# Patient Record
Sex: Male | Born: 1985 | Hispanic: No | Marital: Married | Smoking: Never smoker
Health system: Southern US, Community
[De-identification: ages and names within clinical notes are randomized; demographics above are authoritative.]

## PROBLEM LIST (undated history)

## (undated) DIAGNOSIS — L309 Dermatitis, unspecified: Secondary | ICD-10-CM

## (undated) DIAGNOSIS — K589 Irritable bowel syndrome without diarrhea: Secondary | ICD-10-CM

## (undated) DIAGNOSIS — Z8711 Personal history of peptic ulcer disease: Secondary | ICD-10-CM

## (undated) DIAGNOSIS — Z8719 Personal history of other diseases of the digestive system: Secondary | ICD-10-CM

## (undated) DIAGNOSIS — Z23 Encounter for immunization: Secondary | ICD-10-CM

## (undated) DIAGNOSIS — L509 Urticaria, unspecified: Secondary | ICD-10-CM

## (undated) HISTORY — DX: Irritable bowel syndrome, unspecified: K58.9

## (undated) HISTORY — DX: Encounter for immunization: Z23

## (undated) HISTORY — DX: Urticaria, unspecified: L50.9

## (undated) HISTORY — PX: FETAL BLOOD TRANSFUSION: SHX1602

## (undated) HISTORY — DX: Dermatitis, unspecified: L30.9

## (undated) HISTORY — DX: Personal history of peptic ulcer disease: Z87.11

---
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
## 2017-08-07 ENCOUNTER — Emergency Department (HOSPITAL_COMMUNITY): Payer: No Typology Code available for payment source

## 2017-08-07 ENCOUNTER — Encounter (HOSPITAL_COMMUNITY): Payer: Self-pay

## 2017-08-07 ENCOUNTER — Emergency Department (HOSPITAL_COMMUNITY)
Admission: EM | Admit: 2017-08-07 | Discharge: 2017-08-07 | Disposition: A | Discharge status: Home / Self Care | Payer: No Typology Code available for payment source | Attending: Emergency Medicine | Admitting: Emergency Medicine

## 2017-08-07 DIAGNOSIS — Y9389 Activity, other specified: Secondary | ICD-10-CM | POA: Diagnosis not present

## 2017-08-07 DIAGNOSIS — S161XXA Strain of muscle, fascia and tendon at neck level, initial encounter: Secondary | ICD-10-CM | POA: Diagnosis not present

## 2017-08-07 DIAGNOSIS — Y999 Unspecified external cause status: Secondary | ICD-10-CM | POA: Insufficient documentation

## 2017-08-07 DIAGNOSIS — S1980XA Other specified injuries of unspecified part of neck, initial encounter: Secondary | ICD-10-CM | POA: Diagnosis present

## 2017-08-07 DIAGNOSIS — M25561 Pain in right knee: Secondary | ICD-10-CM | POA: Diagnosis not present

## 2017-08-07 DIAGNOSIS — Y92411 Interstate highway as the place of occurrence of the external cause: Secondary | ICD-10-CM | POA: Diagnosis not present

## 2017-08-07 MED ORDER — CYCLOBENZAPRINE HCL 10 MG PO TABS: 10.0000 mg | ORAL_TABLET | Freq: Two times a day (BID) | ORAL | 0 refills | Status: DC | PRN

## 2017-08-07 MED ORDER — IBUPROFEN 400 MG PO TABS
600.0000 mg | ORAL_TABLET | Freq: Once | ORAL | Status: AC
  Administered 2017-08-07: 600 mg via ORAL
  Filled 2017-08-07: qty 1

## 2017-08-07 NOTE — ED Notes (Signed)
Ortho tech called for soft collar, ED supply is currently out of stock.

## 2017-08-07 NOTE — ED Notes (Signed)
Pt stable, ambulatory, states understanding of discharge instructions 
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
## 2017-08-07 NOTE — ED Notes (Signed)
Ortho tech to bring soft collar (none in ED)

## 2017-08-07 NOTE — Discharge Instructions (Signed)
The pain your experiencing is likely due to muscle strain, you may take acetaminophen and flexeril as needed for pain management.You may use soft neck collar for comfort. The muscle soreness should improve over the next week. [facility] in the next week for a recheck if you are still having symptoms. Return to ED if pain is worsening, you develop weakness or numbness of extremities, or new or concerning symptoms develop.
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
## 2017-08-07 NOTE — Progress Notes (Signed)
Orthopedic Tech Progress Note Patient Details:  Caleb GrammesMinh Tieu Warner 02-11-1986 [id number]  Ortho Devices Type of Ortho Device: Soft collar Ortho Device/Splint Location: neck Ortho Device/Splint Interventions: Application   Caleb Warner 08/07/2017, 1:06 PM

## 2019-09-05 ENCOUNTER — Other Ambulatory Visit: Payer: Self-pay

## 2019-09-06 ENCOUNTER — Encounter: Payer: Self-pay | Admitting: Family Medicine

## 2019-09-06 ENCOUNTER — Ambulatory Visit (INDEPENDENT_AMBULATORY_CARE_PROVIDER_SITE_OTHER): Payer: BC Managed Care – PPO | Admitting: Family Medicine

## 2019-09-06 VITALS — BP 125/85 | HR 86 | Temp 98.2°F | Ht 68.5 in | Wt 153.2 lb

## 2019-09-06 DIAGNOSIS — Z0001 Encounter for general adult medical examination with abnormal findings: Secondary | ICD-10-CM

## 2019-09-06 DIAGNOSIS — R110 Nausea: Secondary | ICD-10-CM

## 2019-09-06 DIAGNOSIS — Z0184 Encounter for antibody response examination: Secondary | ICD-10-CM

## 2019-09-06 DIAGNOSIS — Z111 Encounter for screening for respiratory tuberculosis: Secondary | ICD-10-CM

## 2019-09-06 DIAGNOSIS — Z13220 Encounter for screening for lipoid disorders: Secondary | ICD-10-CM

## 2019-09-06 LAB — COMPREHENSIVE METABOLIC PANEL
ALT: 31 U/L (ref 0–53)
AST: 24 U/L (ref 0–37)
Albumin: 4.9 g/dL (ref 3.5–5.2)
Alkaline Phosphatase: 75 U/L (ref 39–117)
BUN: 8 mg/dL (ref 6–23)
CO2: 27 mEq/L (ref 19–32)
Calcium: 9.9 mg/dL (ref 8.4–10.5)
Chloride: 101 mEq/L (ref 96–112)
Creatinine, Ser: 0.82 mg/dL (ref 0.40–1.50)
GFR: 108.98 mL/min (ref >60.00)
Glucose, Bld: 100 mg/dL — ABNORMAL HIGH (ref 70–99)
Potassium: 3.8 mEq/L (ref 3.5–5.1)
Sodium: 138 mEq/L (ref 135–145)
Total Bilirubin: 0.8 mg/dL (ref 0.2–1.2)
Total Protein: 7.7 g/dL (ref 6.0–8.3)

## 2019-09-06 LAB — LIPID PANEL
Cholesterol: 168 mg/dL (ref 0–200)
HDL: 62.5 mg/dL (ref >39.00)
LDL Cholesterol: 75 mg/dL (ref 0–99)
NonHDL: 105.09
Total CHOL/HDL Ratio: 3
Triglycerides: 149 mg/dL (ref 0.0–149.0)
VLDL: 29.8 mg/dL (ref 0.0–40.0)

## 2019-09-06 LAB — CBC
HCT: 48.2 % (ref 39.0–52.0)
Hemoglobin: 16.2 g/dL (ref 13.0–17.0)
MCHC: 33.6 g/dL (ref 30.0–36.0)
MCV: 88.3 fl (ref 78.0–100.0)
Platelets: 315 10*3/uL (ref 150.0–400.0)
RBC: 5.46 Mil/uL (ref 4.22–5.81)
RDW: 12.9 % (ref 11.5–15.5)
WBC: 7.9 10*3/uL (ref 4.0–10.5)

## 2019-09-06 LAB — TSH: TSH: 0.85 u[IU]/mL (ref 0.35–4.50)

## 2019-09-06 NOTE — Patient Instructions (Signed)
It was very nice to see you today!  Keep up the good work!  We will check blood work today.  Come back in 1 year for your next physical, or sooner if needed.   Take care, Dr Jerline Pain  Please try these tips to maintain a healthy lifestyle:   Eat at least 3 REAL meals and 1-2 snacks per day.  Aim for no more than 5 hours between eating.  If you eat breakfast, please do so within one hour of getting up.    Obtain twice as many fruits/vegetables as protein or carbohydrate foods for both lunch and dinner. (Half of each meal should be fruits/vegetables, one quarter protein, and one quarter starchy carbs)   Cut down on sweet beverages. This includes juice, soda, and sweet tea.    Exercise at least 150 minutes every week.    Preventive Care 64-16 Years Old, Male Preventive care refers to lifestyle choices and visits with your health care provider that can promote health and wellness. This includes:  A yearly physical exam. This is also called an annual well check.  Regular dental and eye exams.  Immunizations.  Screening for certain conditions.  Healthy lifestyle choices, such as eating a healthy diet, getting regular exercise, not using drugs or products that contain nicotine and tobacco, and limiting alcohol use. What can I expect for my preventive care visit? Physical exam Your health care provider will check:  Height and weight. These may be used to calculate body mass index (BMI), which is a measurement that tells if you are at a healthy weight.  Heart rate and blood pressure.  Your skin for abnormal spots. Counseling Your health care provider may ask you questions about:  Alcohol, tobacco, and drug use.  Emotional well-being.  Home and relationship well-being.  Sexual activity.  Eating habits.  Work and work Statistician. What immunizations do I need?  Influenza (flu) vaccine  This is recommended every year. Tetanus, diphtheria, and pertussis (Tdap)  vaccine  You may need a Td booster every 10 years. Varicella (chickenpox) vaccine  You may need this vaccine if you have not already been vaccinated. Human papillomavirus (HPV) vaccine  If recommended by your health care provider, you may need three doses over 6 months. Measles, mumps, and rubella (MMR) vaccine  You may need at least one dose of MMR. You may also need a second dose. Meningococcal conjugate (MenACWY) vaccine  One dose is recommended if you are 60-88 years old and a Market researcher living in a residence hall, or if you have one of several medical conditions. You may also need additional booster doses. Pneumococcal conjugate (PCV13) vaccine  You may need this if you have certain conditions and were not previously vaccinated. Pneumococcal polysaccharide (PPSV23) vaccine  You may need one or two doses if you smoke cigarettes or if you have certain conditions. Hepatitis A vaccine  You may need this if you have certain conditions or if you travel or work in places where you may be exposed to hepatitis A. Hepatitis B vaccine  You may need this if you have certain conditions or if you travel or work in places where you may be exposed to hepatitis B. Haemophilus influenzae type b (Hib) vaccine  You may need this if you have certain risk factors. You may receive vaccines as individual doses or as more than one vaccine together in one shot (combination vaccines). Talk with your health care provider about the risks and benefits of combination vaccines.  What tests do I need? Blood tests  Lipid and cholesterol levels. These may be checked every 5 years starting at age 81.  Hepatitis C test.  Hepatitis B test. Screening   Diabetes screening. This is done by checking your blood sugar (glucose) after you have not eaten for a while (fasting).  Sexually transmitted disease (STD) testing. Talk with your health care provider about your test results, treatment  options, and if necessary, the need for more tests. Follow these instructions at home: Eating and drinking   Eat a diet that includes fresh fruits and vegetables, whole grains, lean protein, and low-fat dairy products.  Take vitamin and mineral supplements as recommended by your health care provider.  Do not drink alcohol if your health care provider tells you not to drink.  If you drink alcohol: ? Limit how much you have to 0-2 drinks a day. ? Be aware of how much alcohol is in your drink. In the [place] one drink equals one 12 oz bottle of beer (355 mL), one 5 oz glass of wine (148 mL), or one 1 oz glass of hard liquor (44 mL). Lifestyle  Take daily care of your teeth and gums.  Stay active. Exercise for at least 30 minutes on 5 or more days each week.  Do not use any products that contain nicotine or tobacco, such as cigarettes, e-cigarettes, and chewing tobacco. If you need help quitting, ask your health care provider.  If you are sexually active, practice safe sex. Use a condom or other form of protection to prevent STIs (sexually transmitted infections). What's next?  Go to your health care provider once a year for a well check visit.  Ask your health care provider how often you should have your eyes and teeth checked.  Stay up to date on all vaccines. This information is not intended to replace advice given to you by your health care provider. Make sure you discuss any questions you have with your health care provider. Document Released: 11/15/2001 Document Revised: 09/13/2018 Document Reviewed: 09/13/2018 Elsevier Patient Education  2020 [employer].

## 2019-09-06 NOTE — Progress Notes (Addendum)
Chief Complaint:  Caleb Warner is a 33 y.o. male who presents today for his annual comprehensive physical exam and to establish care.   Assessment/Plan:  Nausea No red flags.  Check CBC, C met, TSH.  Preventative Healthcare: Check CBC, C met, TSH, lipid panel.  Check QuantiFERON gold for school-received BCG vaccine as a child.  Needs to have proof of vaccination against hepatitis B and varicella for work.  We will draw titers as he was not born in this country and is not sure what vaccine he had in the past.  Patient Counseling(The following topics were reviewed and/or handout was given):  -Nutrition: Stressed importance of moderation in sodium/caffeine intake, saturated fat and cholesterol, caloric balance, sufficient intake of fresh fruits, vegetables, and fiber.  -Stressed the importance of regular exercise.   -Substance Abuse: Discussed cessation/primary prevention of tobacco, alcohol, or other drug use; driving or other dangerous activities under the influence; availability of treatment for abuse.   -Injury prevention: Discussed safety belts, safety helmets, smoke detector, smoking near bedding or upholstery.   -Sexuality: Discussed sexually transmitted diseases, partner selection, use of condoms, avoidance of unintended pregnancy and contraceptive alternatives.   -Dental health: Discussed importance of regular tooth brushing, flossing, and dental visits.  -Health maintenance and immunizations reviewed. Please refer to Health maintenance section.  Return to care in 1 year for next preventative visit.     Subjective:  HPI:  He has no acute complaints today.   He is having some increasing nausea for a few weeks when waking up in the morning.  No vomiting.  No abdominal pain.  His stable, chronic medical conditions are outlined below:   Lifestyle  Diet: Pescatarian  Exercise: Likes to walk and ride bicycles.   Depression screen PHQ 2/9 09/06/2019  Decreased Interest  0  Down, Depressed, Hopeless 0  PHQ - 2 Score 0    Health Maintenance Due  Topic Date Due  . HIV Screening  10/30/2002     ROS: Per HPI, otherwise a complete review of systems was negative.   PMH:  The following were reviewed and entered/updated in epic: Past Medical History:  Diagnosis Date  . History of stomach ulcers   . IBS (irritable bowel syndrome)    There are no active problems to display for this patient.  Past Surgical History:  Procedure Laterality Date  . FETAL BLOOD TRANSFUSION     Family History  Problem Relation Age of Onset  . Diabetes Maternal Grandmother   . Prostate cancer Neg Hx     Medications- reviewed and updated No current outpatient medications on file.   No current facility-administered medications for this visit.    Allergies-reviewed and updated No Known Allergies  Social History   Socioeconomic History  . Marital status: Married    Spouse name: Not on file  . Number of children: Not on file  . Years of education: Not on file  . Highest education level: Not on file  Occupational History  . Not on file  Social Needs  . Financial resource strain: Not on file  . Food insecurity    Worry: Not on file    Inability: Not on file  . Transportation needs    Medical: Not on file    Non-medical: Not on file  Tobacco Use  . Smoking status: Never Smoker  . Smokeless tobacco: Never Used  Substance and Sexual Activity  . Alcohol Use    Frequency: Never  . Drug use: No  .  Sexual activity: Not on file  Lifestyle  . Physical activity    Days per week: Not on file    Minutes per session: Not on file  . Stress: Not on file  Relationships  . Social Herbalist on phone: Not on file    Gets together: Not on file    Attends religious service: Not on file    Active member of club or organization: Not on file    Attends meetings of clubs or organizations: Not on file    Relationship status: Not on file  Other Topics Concern   . Not on file  Social History Narrative  . Not on file        Objective:  Physical Exam: BP 125/85   Pulse 86   Temp 98.2 F (36.8 C)   Ht 5' 8.5" (1.74 m)   Wt 153 lb 3.2 oz (69.5 kg)   SpO2 96%   BMI 22.96 kg/m   Body mass index is 22.96 kg/m. Wt Readings from Last 3 Encounters:  09/06/19 153 lb 3.2 oz (69.5 kg)  08/07/17 130 lb (59 kg)   Gen: NAD, resting comfortably HEENT: TMs normal bilaterally. OP clear. No thyromegaly noted.  CV: RRR with no murmurs appreciated Pulm: NWOB, CTAB with no crackles, wheezes, or rhonchi GI: Normal bowel sounds present. Soft, Nontender, Nondistended. MSK: no edema, cyanosis, or clubbing noted Skin: warm, dry Neuro: CN2-12 grossly intact. Strength 5/5 in upper and lower extremities. Reflexes symmetric and intact bilaterally.  Psych: Normal affect and thought content     Caleb Warner M. Jerline Pain, MD 09/06/2019 1:53 PM

## 2019-09-06 NOTE — Addendum Note (Signed)
Addended by: Vivi Barrack on: 09/06/2019 01:59 PM   Modules accepted: Orders

## 2019-09-09 LAB — QUANTIFERON-TB GOLD PLUS
Mitogen-NIL: 6.9 IU/mL
NIL: 0.46 IU/mL
QuantiFERON-TB Gold Plus: POSITIVE — AB
TB1-NIL: 2.22 IU/mL
TB2-NIL: 3.06 IU/mL

## 2019-09-09 LAB — HEPATITIS B CORE ANTIBODY, TOTAL: Hep B Core Total Ab: NONREACTIVE

## 2019-09-09 LAB — HEPATITIS B SURFACE ANTIBODY,QUALITATIVE: Hep B S Ab: REACTIVE — AB

## 2019-09-09 LAB — VARICELLA ZOSTER ANTIBODY, IGG: Varicella IgG: <135 index — ABNORMAL LOW

## 2019-09-09 LAB — HEPATITIS B SURFACE ANTIGEN: Hepatitis B Surface Ag: NONREACTIVE

## 2019-09-10 NOTE — Progress Notes (Signed)
Please inform patient of the following:  He is immune to hepatitis B - does not need any vaccine for this. He is NOT immune to varicella - recommend getting vaccine.  He can come here to get this.  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  Caleb Warner. Jerline Pain, MD 09/10/2019 8:02 AM

## 2019-09-11 ENCOUNTER — Telehealth: Payer: Self-pay | Admitting: Family Medicine

## 2019-09-11 ENCOUNTER — Other Ambulatory Visit: Payer: Self-pay

## 2019-09-11 DIAGNOSIS — R7612 Nonspecific reaction to cell mediated immunity measurement of gamma interferon antigen response without active tuberculosis: Secondary | ICD-10-CM

## 2019-09-11 NOTE — Telephone Encounter (Signed)
Copied from [place] [id number]. Topic: Quick Communication - See Telephone Encounter >> Sep 11, 2019  3:41 PM Loma Boston wrote: CRM for notification. See Telephone encounter for: 09/11/19. 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 Must determine [phone]

## 2019-09-12 ENCOUNTER — Ambulatory Visit: Payer: BC Managed Care – PPO | Admitting: Internal Medicine

## 2019-09-12 ENCOUNTER — Encounter: Payer: Self-pay | Admitting: Internal Medicine

## 2019-09-12 ENCOUNTER — Ambulatory Visit
Admission: RE | Admit: 2019-09-12 | Discharge: 2019-09-12 | Disposition: A | Discharge status: Home / Self Care | Payer: BC Managed Care – PPO | Source: Ambulatory Visit | Attending: Internal Medicine | Admitting: Internal Medicine

## 2019-09-12 ENCOUNTER — Other Ambulatory Visit: Payer: Self-pay

## 2019-09-12 DIAGNOSIS — R7612 Nonspecific reaction to cell mediated immunity measurement of gamma interferon antigen response without active tuberculosis: Secondary | ICD-10-CM

## 2019-09-12 DIAGNOSIS — Z227 Latent tuberculosis: Secondary | ICD-10-CM | POA: Insufficient documentation

## 2019-09-12 NOTE — Progress Notes (Signed)
[facility]  Reason for Consult: Latent tuberculosis Referring Provider: Dr. 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  He was born in an area of the world where TB was still endemic at the time of his birth.  QuantiFERON assay is not affected by BCG vaccination.  He does not have any clinical findings to suggest active TB but I will order a repeat chest x-ray today.  I will also screen him for HIV and see him back in 1 week to discuss treatment options.  The FDA recently announced that potentially carcinogenic nitrosamine impurities were found and rifampin and rifapentine.  It may be best to consider treatment with isoniazid.  Plan: 1. Check HIV antibody 2. Chest x-ray 3. Follow-up in 1 week  Patient Active Problem List   Diagnosis Date Noted  . Positive QuantiFERON-TB Gold test 09/12/2019    Patient's Medications   No medications on file    HPI: Caleb Warner is a 33 y.o. male who was referred for evaluation and management of a positive QuantiFERON TB Gold assay.  He was born in [place], [place] and lived there until he came to the [place] at age 7.  He is not sure if he was ever exposed to anyone with active TB.  He says that he has been told that he was given BCG vaccine when he was very young.  He has been in good health throughout his life with exception of one episode of GI bleeding caused by gastric ulcer several years ago.  He has had no other hospitalizations.  He is not on any medications.  He has no known risk factors for HIV.  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  He was evaluated at a local health department.  He had a normal chest x-ray and told that nothing more needed to be done.  He was not offered treatment for possible latent tuberculosis.  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  He  recently underwent a routine well exam with his PCP.  QuantiFERON TB Gold assay was positive.  He has not had any fever, chills or sweats.  He denies cough and shortness of breath.  His appetite remains normal and he has not lost any weight.  Review of Systems: Review of Systems  Constitutional: Negative for chills, diaphoresis, fever and weight loss.  Respiratory: Negative for cough, sputum production and shortness of breath.   Cardiovascular: Negative for chest pain.  Gastrointestinal: Negative for abdominal pain, diarrhea, nausea and vomiting.      Past Medical History:  Diagnosis Date  . History of stomach ulcers   . IBS (irritable bowel syndrome)   . Immunization, BCG     Social History   Tobacco Use  . Smoking status: Never Smoker  . Smokeless tobacco: Never Used  Substance Use Topics  . Alcohol use: Not Currently  . Drug use: No    Family History  Problem Relation Age of Onset  . Diabetes Maternal Grandmother   . Prostate cancer Neg Hx    No Known Allergies  OBJECTIVE: Vitals:   09/12/19 1454  BP: 131/82  Pulse: (!) 101  Weight: 152 lb 9.6 oz (69.2 kg)   Body mass index is 22.87 kg/m.   Physical Exam Constitutional:      Comments: He is  a healthy-appearing young man.  Cardiovascular:     Rate and Rhythm: Normal rate and regular rhythm.     Heart sounds: No murmur.  Pulmonary:     Effort: Pulmonary effort is normal.     Breath sounds: Normal breath sounds.  Abdominal:     Palpations: Abdomen is soft.     Tenderness: There is no abdominal tenderness.  Musculoskeletal:        General: No swelling or tenderness.  Lymphadenopathy:     Head:     Right side of head: No submandibular adenopathy.     Left side of head: No submandibular adenopathy.     Cervical: No cervical adenopathy.     Upper Body:     Right upper body: No axillary or epitrochlear adenopathy.     Left upper body: No axillary or epitrochlear adenopathy.  Skin:    Findings: No  rash.  Neurological:     General: No focal deficit present.  Psychiatric:        Mood and Affect: Mood normal.   Labs 09/06/2019  Lab Results  Component Value Date   WBC 7.9 09/06/2019   HGB 16.2 09/06/2019   HCT 48.2 09/06/2019   MCV 88.3 09/06/2019   PLT 315.0 09/06/2019   CMP     Component Value Date/Time   NA 138 09/06/2019 1408   K 3.8 09/06/2019 1408   CL 101 09/06/2019 1408   CO2 27 09/06/2019 1408   GLUCOSE 100 (H) 09/06/2019 1408   BUN 8 09/06/2019 1408   CREATININE 0.82 09/06/2019 1408   CALCIUM 9.9 09/06/2019 1408   PROT 7.7 09/06/2019 1408   ALBUMIN 4.9 09/06/2019 1408   AST 24 09/06/2019 1408   ALT 31 09/06/2019 1408   ALKPHOS 75 09/06/2019 1408   BILITOT 0.8 09/06/2019 1408    Microbiology: No results found for this or any previous visit (from the past 240 hour(s)).  Cliffton Asters, MD [facility] [phone] pager   [phone] cell 09/12/2019, 4:34 PM

## 2019-09-12 NOTE — Telephone Encounter (Signed)
Left voice message for patient to call clinic.  

## 2019-09-13 LAB — HIV ANTIBODY (ROUTINE TESTING W REFLEX): HIV 1&2 Ab, 4th Generation: NONREACTIVE

## 2019-09-18 ENCOUNTER — Ambulatory Visit: Payer: BC Managed Care – PPO | Admitting: Internal Medicine

## 2019-09-19 ENCOUNTER — Ambulatory Visit (INDEPENDENT_AMBULATORY_CARE_PROVIDER_SITE_OTHER): Payer: BC Managed Care – PPO | Admitting: Internal Medicine

## 2019-09-19 ENCOUNTER — Other Ambulatory Visit: Payer: Self-pay

## 2019-09-19 ENCOUNTER — Encounter: Payer: Self-pay | Admitting: Internal Medicine

## 2019-09-19 DIAGNOSIS — Z227 Latent tuberculosis: Secondary | ICD-10-CM | POA: Diagnosis not present

## 2019-09-19 NOTE — Progress Notes (Signed)
         [facility] Infectious Disease  Patient Active Problem List   Diagnosis Date Noted  . Latent tuberculosis by blood test 09/12/2019    Patient's Medications   No medications on file    Subjective: Caleb Warner is in for his routine follow-up visit.  He has a positive QuantiFERON TB Gold assay that probably represents latent tuberculosis acquired when he was an infant in [place].  He is HIV antibody negative.  Chest x-ray obtained last week was normal with no evidence of old TB granulomas or active infiltrates.  Review of Systems: Review of Systems  Constitutional: Negative for chills, diaphoresis, fever and weight loss.  Respiratory: Negative for cough, sputum production and shortness of breath.     Past Medical History:  Diagnosis Date  . History of stomach ulcers   . IBS (irritable bowel syndrome)   . Immunization, BCG     Social History   Tobacco Use  . Smoking status: Never Smoker  . Smokeless tobacco: Never Used  Substance Use Topics  . Alcohol use: Not Currently  . Drug use: No    Family History  Problem Relation Age of Onset  . Diabetes Maternal Grandmother   . Prostate cancer Neg Hx     No Known Allergies  Objective: Vitals:   09/19/19 1436  BP: 121/76  Pulse: (!) 101  Temp: 98.6 F (37 C)  TempSrc: Oral  Weight: 153 lb (69.4 kg)   Body mass index is 22.93 kg/m.  Physical Exam Constitutional:      Comments: He is in good spirits.  Cardiovascular:     Rate and Rhythm: Normal rate and regular rhythm.     Heart sounds: No murmur.  Pulmonary:     Effort: Pulmonary effort is normal.     Breath sounds: Normal breath sounds.  Psychiatric:        Mood and Affect: Mood normal.       Problem List Items Addressed This Visit      Unprioritized   Latent tuberculosis by blood test    I reviewed management options with him.  Treatment of latent tuberculosis is currently complicated by the fact that the FDA released and alert in August  of this year stating that rifamycin antibiotics have been found to contain nitrosamines which have been linked to cancer.  Standard treatment with isoniazid for 9 months is an option but I recommended that we simply revisit the decision about 6 months and see if the FDA alert has been lifted.  He is in agreement with that plan.  He will follow-up here in 6 months.          Michel Bickers, MD [facility] [phone] pager   [phone] cell 09/19/2019, 3:03 PM

## 2019-09-19 NOTE — Assessment & Plan Note (Signed)
I reviewed management options with him.  Treatment of latent tuberculosis is currently complicated by the fact that the FDA released and alert in August of this year stating that rifamycin antibiotics have been found to contain nitrosamines which have been linked to cancer.  Standard treatment with isoniazid for 9 months is an option but I recommended that we simply revisit the decision about 6 months and see if the FDA alert has been lifted.  He is in agreement with that plan.  He will follow-up here in 6 months.

## 2020-10-20 IMAGING — DX DG CHEST 2V
2 series · 2 of 2 positions shown · non-contrast
Comparison: None.

CLINICAL DATA: Positive QuantiFERON TB test.

EXAM:
CHEST - 2 VIEW

[dg chest 2 view (1 of 2)]
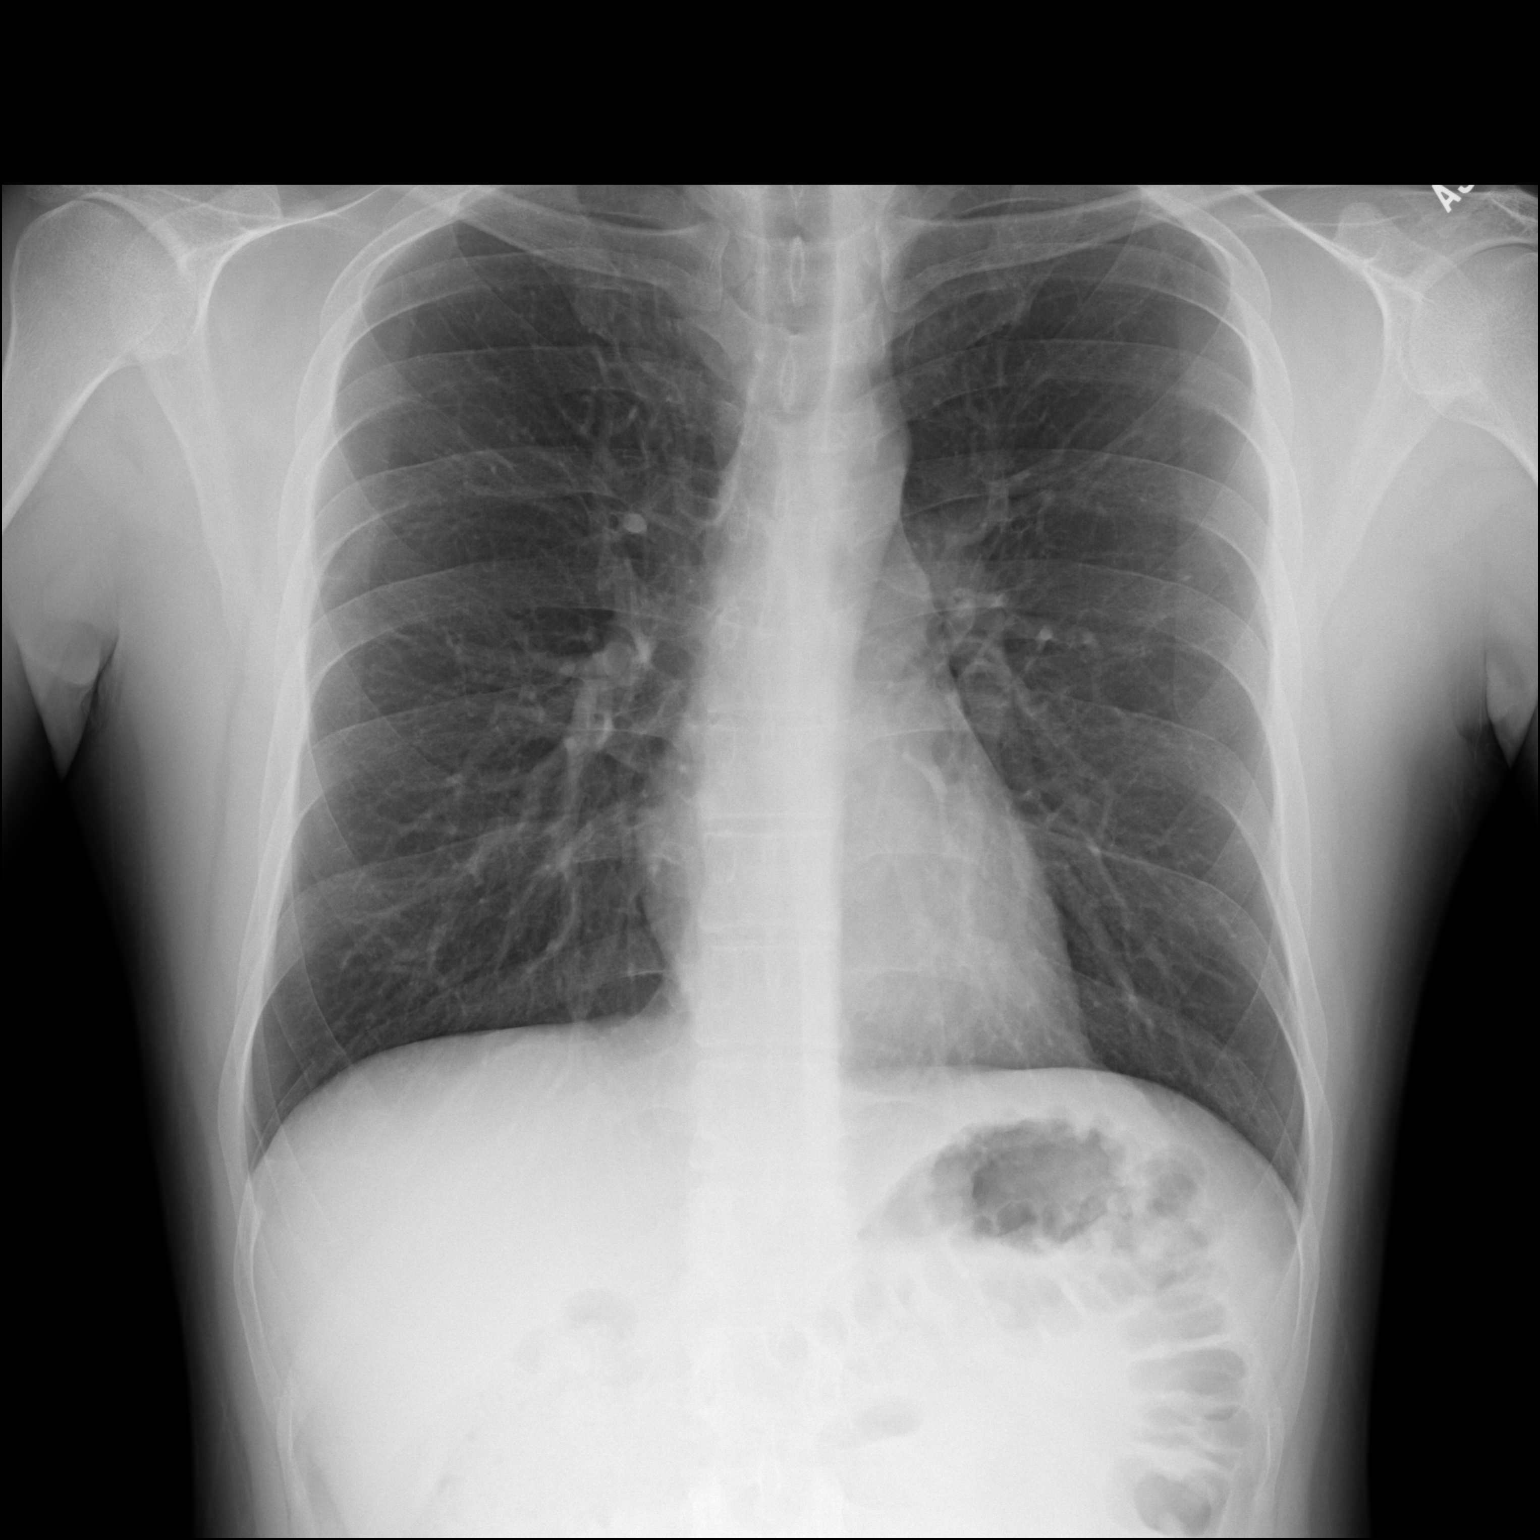

[dg chest 2 view (2 of 2)]
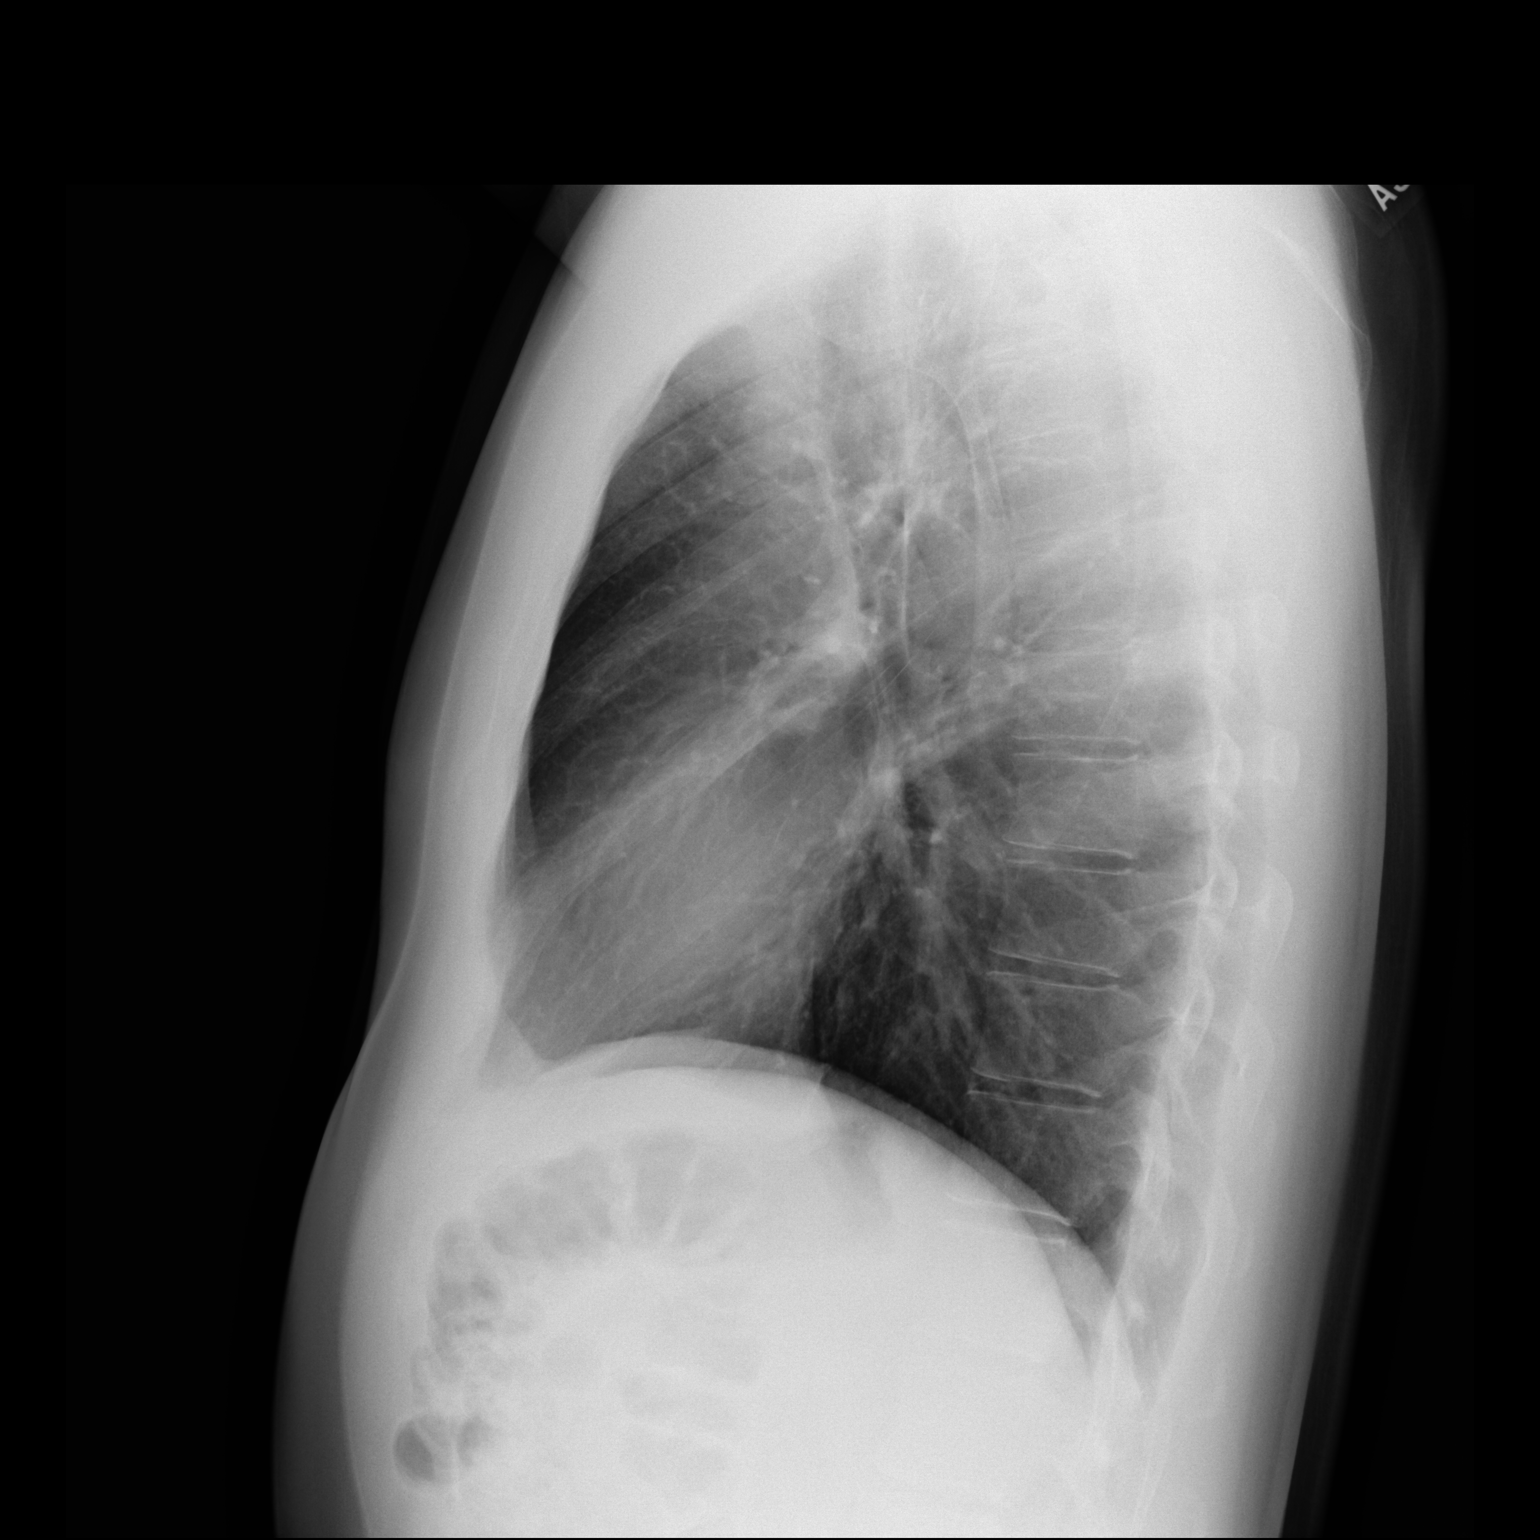

[2 of 2 positions shown; findings below may reference images not displayed]

FINDINGS: The heart size and mediastinal contours are within normal limits.
Both lungs are clear. The visualized skeletal structures are
unremarkable.
IMPRESSION: No active cardiopulmonary disease.

## 2021-03-12 ENCOUNTER — Ambulatory Visit: Payer: BC Managed Care – PPO | Admitting: Family Medicine

## 2021-03-12 ENCOUNTER — Other Ambulatory Visit: Payer: Self-pay

## 2021-03-12 ENCOUNTER — Encounter: Payer: Self-pay | Admitting: Family Medicine

## 2021-03-12 VITALS — BP 125/72 | HR 95 | Temp 97.9°F | Ht 68.5 in | Wt 147.2 lb

## 2021-03-12 DIAGNOSIS — R21 Rash and other nonspecific skin eruption: Secondary | ICD-10-CM

## 2021-03-12 MED ORDER — TRIAMCINOLONE ACETONIDE 0.5 % EX OINT: 1.0000 "application " | TOPICAL_OINTMENT | Freq: Two times a day (BID) | CUTANEOUS | 0 refills | Status: DC

## 2021-03-12 MED ORDER — KETOCONAZOLE 2 % EX CREA: 1.0000 "application " | TOPICAL_CREAM | Freq: Two times a day (BID) | CUTANEOUS | 0 refills | Status: DC

## 2021-03-12 NOTE — Progress Notes (Signed)
   Caleb Warner is a 35 y.o. male who presents today for an office visit.  Assessment/Plan:  New/Acute Problems: Rash Likely tinea corporis.  Eczema versus psoriasis also on the differential though less likely.  Will start topical ketoconazole.  If no improvement will try topical triamcinolone.  May consider biopsy of the nodes have improved.  Discussed reasons to return to care.  Follow-up as needed.    Subjective:  HPI:  Patient here with rash.  Started several months to a year ago.  Located on left upper extremity.  Has noticed some redness.  Some itching.  Tried allergy medications without improvement.  Recently moved into a new house and is not sure if this is contributing.  He works as a Chartered loss adjuster.  Symptoms seem to be worsening.  No other specific treatments tried.        Objective:  Physical Exam: BP 125/72   Pulse 95   Temp 97.9 F (36.6 C)   Ht 5' 8.5" (1.74 m)   Wt 147 lb 3.2 oz (66.8 kg)   SpO2 96%   BMI 22.06 kg/m   Gen: No acute distress, resting comfortably CV: Regular rate and rhythm with no murmurs appreciated Pulm: Normal work of breathing, clear to auscultation bilaterally with no crackles, wheezes, or rhonchi Skin: Several erythematous plaques on left upper extremity with rolled borders.  Neuro: Grossly normal, moves all extremities Psych: Normal affect and thought content      Nosson Wender M. Jimmey Ralph, MD 03/12/2021 2:56 PM

## 2021-03-12 NOTE — Patient Instructions (Signed)
It was very nice to see you today!  Please start the ketoconazole.  Apply twice daily for 1 to 2 weeks.  If not improving then please try the triamcinolone.  Please check in with me in a month let me know how things are going.  Take care, Dr Jimmey Ralph  PLEASE NOTE:  If you had any lab tests please let us know if you have not heard back within a few days. You may see your results on mychart before we have a chance to review them but we will give you a call once they are reviewed by [place]. If we ordered any referrals today, please let us know if you have not heard from their office within the next week.   Please try these tips to maintain a healthy lifestyle:  Eat at least 3 REAL meals and 1-2 snacks per day.  Aim for no more than 5 hours between eating.  If you eat breakfast, please do so within one hour of getting up.   Each meal should contain half fruits/vegetables, one quarter protein, and one quarter carbs (no bigger than a computer mouse)  Cut down on sweet beverages. This includes juice, soda, and sweet tea.   Drink at least 1 glass of water with each meal and aim for at least 8 glasses per day  Exercise at least 150 minutes every week.

## 2021-03-18 ENCOUNTER — Encounter: Payer: BC Managed Care – PPO | Admitting: Family Medicine

## 2021-03-25 ENCOUNTER — Ambulatory Visit: Payer: BC Managed Care – PPO | Admitting: Family Medicine

## 2021-04-20 ENCOUNTER — Telehealth: Payer: Self-pay

## 2021-04-20 DIAGNOSIS — R7612 Nonspecific reaction to cell mediated immunity measurement of gamma interferon antigen response without active tuberculosis: Secondary | ICD-10-CM

## 2021-04-20 NOTE — Telephone Encounter (Signed)
Referral to [facility] has been placed.

## 2021-04-20 NOTE — Telephone Encounter (Signed)
Patient called in and stated he needs a new referral  sent to Dr. Orvan Falconer at [facility] for Infectious Disease. Patient states he has Tuberculosis.

## 2021-04-26 ENCOUNTER — Ambulatory Visit: Payer: BC Managed Care – PPO | Admitting: Family Medicine

## 2021-04-26 ENCOUNTER — Other Ambulatory Visit: Payer: Self-pay | Admitting: *Deleted

## 2021-04-26 ENCOUNTER — Encounter: Payer: Self-pay | Admitting: Family Medicine

## 2021-04-26 DIAGNOSIS — R21 Rash and other nonspecific skin eruption: Secondary | ICD-10-CM

## 2021-04-26 NOTE — Telephone Encounter (Signed)
See note

## 2021-04-26 NOTE — Telephone Encounter (Signed)
Referral placed.

## 2021-05-10 ENCOUNTER — Other Ambulatory Visit (HOSPITAL_COMMUNITY): Payer: Self-pay

## 2021-05-11 ENCOUNTER — Ambulatory Visit: Payer: BC Managed Care – PPO | Admitting: Internal Medicine

## 2021-05-11 ENCOUNTER — Other Ambulatory Visit: Payer: Self-pay

## 2021-05-11 DIAGNOSIS — Z227 Latent tuberculosis: Secondary | ICD-10-CM | POA: Diagnosis not present

## 2021-05-11 LAB — COMPREHENSIVE METABOLIC PANEL
AG Ratio: 1.7 (calc) (ref 1.0–2.5)
ALT: 30 U/L (ref 9–46)
AST: 23 U/L (ref 10–40)
Albumin: 4.7 g/dL (ref 3.6–5.1)
Alkaline phosphatase (APISO): 76 U/L (ref 36–130)
BUN: 9 mg/dL (ref 7–25)
CO2: 28 mmol/L (ref 20–32)
Calcium: 10 mg/dL (ref 8.6–10.3)
Chloride: 102 mmol/L (ref 98–110)
Creat: 0.88 mg/dL (ref 0.60–1.26)
Globulin: 2.7 g/dL (calc) (ref 1.9–3.7)
Glucose, Bld: 92 mg/dL (ref 65–99)
Potassium: 4.1 mmol/L (ref 3.5–5.3)
Sodium: 138 mmol/L (ref 135–146)
Total Bilirubin: 0.6 mg/dL (ref 0.2–1.2)
Total Protein: 7.4 g/dL (ref 6.1–8.1)

## 2021-05-11 LAB — CBC
HCT: 48.3 % (ref 38.5–50.0)
Hemoglobin: 16.1 g/dL (ref 13.2–17.1)
MCH: 29.5 pg (ref 27.0–33.0)
MCHC: 33.3 g/dL (ref 32.0–36.0)
MCV: 88.5 fL (ref 80.0–100.0)
MPV: 10.5 fL (ref 7.5–12.5)
Platelets: 297 10*3/uL (ref 140–400)
RBC: 5.46 10*6/uL (ref 4.20–5.80)
RDW: 12.2 % (ref 11.0–15.0)
WBC: 7.6 10*3/uL (ref 3.8–10.8)

## 2021-05-11 MED ORDER — ISONIAZID 300 MG PO TABS: 300.0000 mg | ORAL_TABLET | Freq: Every day | ORAL | 5 refills | Status: DC

## 2021-05-11 MED ORDER — VITAMIN B-6 50 MG PO TABS: 50.0000 mg | ORAL_TABLET | Freq: Every day | ORAL | 5 refills | Status: DC

## 2021-05-11 NOTE — Assessment & Plan Note (Signed)
He is interested in starting therapy for latent tuberculosis.  I told him that rifapentine is on national backorder now.  I talked him about using INH alone and he is in agreement with starting.  I talked about potential side effects.  He does not drink any alcohol and his nutritional status is excellent.  His risk of significant side effects is very low.  He will get baseline lab work today and start isoniazid 300 mg daily along with vitamin B6.  I told him that I would recommend a minimum of 6 months of therapy.  He will follow-up in 1 month.

## 2021-05-11 NOTE — Progress Notes (Signed)
[facility]  Patient Active Problem List   Diagnosis Date Noted   Latent tuberculosis by blood test 09/12/2019    Patient's Medications  New Prescriptions   ISONIAZID (NYDRAZID) 300 MG TABLET    Take 1 tablet (300 mg total) by mouth daily.   PYRIDOXINE (VITAMIN B-6) 50 MG TABLET    Take 1 tablet (50 mg total) by mouth daily.  Previous Medications   ALTRENO 0.05 % LOTN    Apply topically.   KETOCONAZOLE (NIZORAL) 2 % CREAM    Apply 1 application topically 2 (two) times daily.   TRIAMCINOLONE OINTMENT (KENALOG) 0.5 %    Apply 1 application topically 2 (two) times daily.  Modified Medications   No medications on file  Discontinued Medications   No medications on file    Subjective: Caleb Warner is in for his routine follow-up visit.  He was diagnosed with latent tuberculosis in 2020.  I last saw him in December 2020 and we postponed her decision about treatment because of a warning that had been placed on rifapentine based regimens.  He has not had any new developments since that time.  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  He is not on any oral medication at this time.  He was recently on doxycycline and minocycline for his acne but had difficulty tolerating both of them because of "queasy stomach".  He is fully vaccinated against COVID including 2 boosters but says that he has had COVID infection on 3 separate occasions in the past few years.  He says that he has had some lingering shortness of breath on occasion but otherwise has recovered uneventfully.  Review of Systems: Review of Systems  Constitutional:  Negative for chills, diaphoresis, fever and weight loss.  Respiratory:  Positive for shortness of breath. Negative for cough, hemoptysis and sputum production.   Cardiovascular:  Negative for chest pain.  Gastrointestinal:  Negative for abdominal pain, diarrhea, nausea and vomiting.   Past Medical History:   Diagnosis Date   History of stomach ulcers    IBS (irritable bowel syndrome)    Immunization, BCG     Social History   Tobacco Use   Smoking status: Never   Smokeless tobacco: Never  Vaping Use   Vaping Use: Never used  Substance Use Topics   Alcohol use: Not Currently   Drug use: No    Family History  Problem Relation Age of Onset   Diabetes Maternal Grandmother    Prostate cancer Neg Hx     No Known Allergies  Objective: Vitals:   05/11/21 1430  BP: 104/72  Pulse: (!) 118  Temp: 98.5 F (36.9 C)  TempSrc: Oral  SpO2: 95%  Weight: 150 lb (68 kg)   Body mass index is 22.48 kg/m.  Physical Exam Constitutional:      Comments: He is in good spirits.  Cardiovascular:     Rate and Rhythm: Normal rate and regular rhythm.     Heart sounds: No murmur heard. Pulmonary:     Effort: Pulmonary effort is normal.     Breath sounds: Normal breath sounds.  Psychiatric:        Mood and Affect: Mood normal.    Lab Results    Problem List Items Addressed This Visit       Unprioritized   Latent tuberculosis by blood test    He is interested in starting therapy for  latent tuberculosis.  I told him that rifapentine is on national backorder now.  I talked him about using INH alone and he is in agreement with starting.  I talked about potential side effects.  He does not drink any alcohol and his nutritional status is excellent.  His risk of significant side effects is very low.  He will get baseline lab work today and start isoniazid 300 mg daily along with vitamin B6.  I told him that I would recommend a minimum of 6 months of therapy.  He will follow-up in 1 month.       Relevant Medications   isoniazid (NYDRAZID) 300 MG tablet   pyridOXINE (VITAMIN B-6) 50 MG tablet   Other Relevant Orders   CBC   Comprehensive metabolic panel     Cliffton Asters, MD [facility] for Infectious Disease [facility] [phone] pager   [phone] cell 05/11/2021, 3:38  PM

## 2021-05-12 ENCOUNTER — Other Ambulatory Visit (HOSPITAL_COMMUNITY): Payer: Self-pay

## 2021-05-12 ENCOUNTER — Other Ambulatory Visit: Payer: Self-pay

## 2021-05-12 DIAGNOSIS — Z227 Latent tuberculosis: Secondary | ICD-10-CM

## 2021-05-12 MED ORDER — ISONIAZID 300 MG PO TABS
300.0000 mg | ORAL_TABLET | Freq: Every day | ORAL | 5 refills | Status: DC
  Filled 2021-05-12: qty 30, 30d supply, fill #0
  Filled 2021-06-07: qty 30, 30d supply, fill #1
  Filled 2021-07-07: qty 30, 30d supply, fill #2

## 2021-06-08 ENCOUNTER — Other Ambulatory Visit (HOSPITAL_COMMUNITY): Payer: Self-pay

## 2021-06-09 ENCOUNTER — Other Ambulatory Visit (HOSPITAL_COMMUNITY): Payer: Self-pay

## 2021-07-07 ENCOUNTER — Other Ambulatory Visit: Payer: Self-pay

## 2021-07-07 ENCOUNTER — Encounter: Payer: Self-pay | Admitting: Internal Medicine

## 2021-07-07 ENCOUNTER — Other Ambulatory Visit (HOSPITAL_COMMUNITY): Payer: Self-pay

## 2021-07-07 ENCOUNTER — Telehealth (INDEPENDENT_AMBULATORY_CARE_PROVIDER_SITE_OTHER): Payer: BC Managed Care – PPO | Admitting: Internal Medicine

## 2021-07-07 DIAGNOSIS — Z227 Latent tuberculosis: Secondary | ICD-10-CM | POA: Diagnosis not present

## 2021-07-07 NOTE — Progress Notes (Signed)
Virtual Visit via Telephone Note  I connected with Ryelan Tieu Frieze on 07/07/21 at 11:00 AM EDT by telephone and verified that I am speaking with the correct person using two identifiers.  Location: Patient: Home Provider: [facility]   I discussed the limitations, risks, security and privacy concerns of performing an evaluation and management service by telephone and the availability of in person appointments. I also discussed with the patient that there may be a patient responsible charge related to this service. The patient expressed understanding and agreed to proceed.   History of Present Illness: I called and spoke with Rosser today.  He started on INH and B6 2 months ago as treatment for latent tuberculosis.  He has not missed any doses.  He is tolerating it well.  He has not noted any change in his chronic IBS symptoms.  He frequently has some abdominal cramping and loose bowel movements in the morning.  He has not had any new abdominal pain, nausea or vomiting.   Observations/Objective:   Assessment and Plan: He is tolerating treatment for latent tuberculosis.  He will follow-up in 4 months at the end of therapy.  Follow Up Instructions: Continue INH and B6 and follow-up in 4 months   I discussed the assessment and treatment plan with the patient. The patient was provided an opportunity to ask questions and all were answered. The patient agreed with the plan and demonstrated an understanding of the instructions.   The patient was advised to call back or seek an in-person evaluation if the symptoms worsen or if the condition fails to improve as anticipated.  I provided 14 minutes of non-face-to-face time during this encounter.   Cliffton Asters, MD

## 2021-07-08 ENCOUNTER — Other Ambulatory Visit (HOSPITAL_COMMUNITY): Payer: Self-pay

## 2021-07-09 ENCOUNTER — Other Ambulatory Visit (HOSPITAL_COMMUNITY): Payer: Self-pay

## 2021-07-09 ENCOUNTER — Other Ambulatory Visit: Payer: Self-pay | Admitting: Internal Medicine

## 2021-07-09 ENCOUNTER — Telehealth: Payer: Self-pay

## 2021-07-09 DIAGNOSIS — Z227 Latent tuberculosis: Secondary | ICD-10-CM

## 2021-07-09 MED ORDER — ISONIAZID 300 MG PO TABS
300.0000 mg | ORAL_TABLET | Freq: Every day | ORAL | 3 refills | Status: DC
  Filled 2021-08-12: qty 30, 30d supply, fill #0
  Filled 2021-09-14: qty 30, 30d supply, fill #1
  Filled 2021-10-25: qty 30, 30d supply, fill #2

## 2021-07-09 NOTE — Telephone Encounter (Signed)
Received notification that no [facility] outpatient pharmacies have a supply of isoniazid 300mg  tablets. [facility] does not have supply either.   Confirmed that [facility] on [address] has in stock. Canceled prescription with [facility] and resent remaining refills to [facility]. Called patient to update him about pharmacy switch, no answer. Will send MyChart message.   [facility], RN

## 2021-07-22 ENCOUNTER — Encounter: Payer: Self-pay | Admitting: Internal Medicine

## 2021-07-22 ENCOUNTER — Other Ambulatory Visit: Payer: Self-pay

## 2021-07-22 ENCOUNTER — Telehealth (INDEPENDENT_AMBULATORY_CARE_PROVIDER_SITE_OTHER): Payer: BC Managed Care – PPO | Admitting: Internal Medicine

## 2021-07-22 DIAGNOSIS — Z227 Latent tuberculosis: Secondary | ICD-10-CM | POA: Diagnosis not present

## 2021-07-22 NOTE — Progress Notes (Signed)
Virtual Visit via Video Note  I connected with Caleb Warner on 07/22/21 at  8:45 AM EDT by a video enabled telemedicine application and verified that I am speaking with the correct person using two identifiers.  Location: Patient: Home Provider: [facility]   I discussed the limitations of evaluation and management by telemedicine and the availability of in person appointments. The patient expressed understanding and agreed to proceed.  History of Present Illness: I conducted a video visit with Caleb Warner today.  He started on isoniazid for treatment of latent tuberculosis on 05/11/2021.  He tolerated it well.  About 2 weeks ago he began to develop early morning lower abdominal cramps.  He states that the cramps are intermittent and come 2-4 times each week.  He takes his isoniazid around 4 PM.  He describes the cramps as being uncomfortable but not painful.  Sometimes the cramps will wake him from sleep.  He has not taking anything for the cramps.  He says that the cramps tend to get better after he gets up and walks around.  He has not had any diarrhea.  He says there has been no change in his morning "stomach upset" and soft stools related to his irritable bowel syndrome.  He does not feel like the cramps are enough for him to want to stop isoniazid.   Observations/Objective:   Assessment and Plan: I told him that I doubt that isoniazid is the cause of his cramps we could take a drug holiday to see if the cramps go away completely.  He has an appointment with his PCP on 08/10/2021.  He prefers to continue isoniazid for now.  Follow Up Instructions: Continue isoniazid and B6 Follow-up video visit on 08/19/2021   I discussed the assessment and treatment plan with the patient. The patient was provided an opportunity to ask questions and all were answered. The patient agreed with the plan and demonstrated an understanding of the instructions.   The patient was advised to call back or seek an  in-person evaluation if the symptoms worsen or if the condition fails to improve as anticipated.  I provided 15 minutes of non-face-to-face time during this encounter.   Cliffton Asters, MD

## 2021-08-10 ENCOUNTER — Telehealth: Payer: BC Managed Care – PPO | Admitting: Family Medicine

## 2021-08-10 NOTE — Progress Notes (Signed)
Patient schedule for virtual visit however was not able to be contacted ny phone or virtually. He was not seen or evaluated today.   Katina Degree. Jimmey Ralph, MD 08/10/2021 11:39 AM

## 2021-08-11 ENCOUNTER — Other Ambulatory Visit (HOSPITAL_COMMUNITY): Payer: Self-pay

## 2021-08-12 ENCOUNTER — Other Ambulatory Visit (HOSPITAL_COMMUNITY): Payer: Self-pay

## 2021-08-19 ENCOUNTER — Ambulatory Visit: Payer: BC Managed Care – PPO | Admitting: Internal Medicine

## 2021-08-25 ENCOUNTER — Other Ambulatory Visit: Payer: Self-pay

## 2021-08-25 ENCOUNTER — Ambulatory Visit: Payer: BC Managed Care – PPO | Admitting: Family Medicine

## 2021-08-25 VITALS — BP 120/60 | HR 89 | Temp 98.0°F | Ht 68.5 in | Wt 146.2 lb

## 2021-08-25 DIAGNOSIS — K589 Irritable bowel syndrome without diarrhea: Secondary | ICD-10-CM | POA: Diagnosis not present

## 2021-08-25 DIAGNOSIS — Z8719 Personal history of other diseases of the digestive system: Secondary | ICD-10-CM | POA: Diagnosis not present

## 2021-08-25 MED ORDER — DICYCLOMINE HCL 20 MG PO TABS: 20.0000 mg | ORAL_TABLET | Freq: Three times a day (TID) | ORAL | 0 refills | Status: DC

## 2021-08-25 NOTE — Assessment & Plan Note (Signed)
No recurrences though given other symptoms will refer to GI.

## 2021-08-25 NOTE — Assessment & Plan Note (Addendum)
Symptoms are not currently controlled.  We discussed treatment options.  We will start Bentyl. Discussed potential side effects.  Given that symptoms have been worsening and his history of unexplained peptic ulcer disease a few years ago will place referral to GI for further evaluation.  Doubt he has IBD though still needs to be considered. Reassuring exam today.

## 2021-08-25 NOTE — Patient Instructions (Signed)
It was very nice to see you today!  We will refer you to see the GI specialist.  Please try the Bentyl.  We will give you a flu shot today.  Take care, Dr Jimmey Ralph  PLEASE NOTE:  If you had any lab tests please let us know if you have not heard back within a few days. You may see your results on mychart before we have a chance to review them but we will give you a call once they are reviewed by [place]. If we ordered any referrals today, please let us know if you have not heard from their office within the next week.   Please try these tips to maintain a healthy lifestyle:  Eat at least 3 REAL meals and 1-2 snacks per day.  Aim for no more than 5 hours between eating.  If you eat breakfast, please do so within one hour of getting up.   Each meal should contain half fruits/vegetables, one quarter protein, and one quarter carbs (no bigger than a computer mouse)  Cut down on sweet beverages. This includes juice, soda, and sweet tea.   Drink at least 1 glass of water with each meal and aim for at least 8 glasses per day  Exercise at least 150 minutes every week.

## 2021-08-25 NOTE — Progress Notes (Signed)
   Caleb Warner is a 35 y.o. male who presents today for an office visit.  Assessment/Plan:  Chronic Problems Addressed Today: History of GI bleed No recurrences though given other symptoms will refer to GI.   IBS (irritable bowel syndrome) Symptoms are not currently controlled.  We discussed treatment options.  We will start Bentyl. Discussed potential side effects.  Given that symptoms have been worsening and his history of unexplained peptic ulcer disease a few years ago will place referral to GI for further evaluation.  Doubt he has IBD though still needs to be considered. Reassuring exam today.  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  We will continue for now and treat IBS as above.  Flu shot given today.     Subjective:  HPI: CC of the Pt is IBS. Current symptoms include bloating, cramping, abdominal pain, and diarrhea.This had started around 5 years ago when he started noticing blood in his stool. Multiple ulcers were found in his stomach, causing low blood counts in the ER.  He was admitted for work-up.  Underwent endoscopy which showed peptic ulcer disease.  Did not have a colonoscopy at this time.  He denies taking NSAIDs during this period of time.   Since then, he would experience bloating and cramping after trying to eat. He states that he also experiences in the morning having to go to the bathroom with soft stool, with occasionally having to go multiple times in the morning. He states that this has been getting worse over the past couple months and that the condition worsens during work time. He denies having blood in stool currently.   Also, he notes in the morning that he experiences a dull pain in his lower left abdomen. He says that it pulses around every 10 seconds and that it wakes him up in the morning. This started a few months ago. He states that he is also started taking an antibiotic a few months ago that is  apparently heavy on the liver. He is unsure if he has experienced weightloss.No fevers or chills.          Objective:  Physical Exam: BP 120/60   Pulse 89   Temp 98 F (36.7 C) (Temporal)   Ht 5' 8.5" (1.74 m)   Wt 146 lb 4 oz (66.3 kg)   SpO2 98%   BMI 21.91 kg/m   Gen: No acute distress, resting comfortably CV: Regular rate and rhythm with no murmurs appreciated Pulm: Normal work of breathing, clear to auscultation bilaterally with no crackles, wheezes, or rhonchi GI: Bowel sounds present. soft, nontender, nondistended. Neuro: Grossly normal, moves all extremities Psych: Normal affect and thought content      I,Jordan Kelly,acting as a scribe for Jacquiline Doe, MD.,have documented all relevant documentation on the behalf of Jacquiline Doe, MD,as directed by  Jacquiline Doe, MD while in the presence of Jacquiline Doe, MD.  I, Jacquiline Doe, MD, have reviewed all documentation for this visit. The documentation on 08/25/21 for the exam, diagnosis, procedures, and orders are all accurate and complete.  Katina Degree. Jimmey Ralph, MD 08/25/2021 8:28 AM

## 2021-09-13 ENCOUNTER — Encounter: Payer: Self-pay | Admitting: Gastroenterology

## 2021-09-14 ENCOUNTER — Other Ambulatory Visit (HOSPITAL_COMMUNITY): Payer: Self-pay

## 2021-09-15 ENCOUNTER — Other Ambulatory Visit (HOSPITAL_COMMUNITY): Payer: Self-pay

## 2021-09-18 ENCOUNTER — Other Ambulatory Visit: Payer: Self-pay | Admitting: Family Medicine

## 2021-10-25 ENCOUNTER — Other Ambulatory Visit (HOSPITAL_COMMUNITY): Payer: Self-pay

## 2021-11-09 ENCOUNTER — Ambulatory Visit: Payer: BC Managed Care – PPO | Admitting: Internal Medicine

## 2021-11-23 ENCOUNTER — Other Ambulatory Visit: Payer: Self-pay

## 2021-11-23 ENCOUNTER — Ambulatory Visit: Payer: BC Managed Care – PPO | Admitting: Internal Medicine

## 2021-11-23 ENCOUNTER — Ambulatory Visit: Payer: BC Managed Care – PPO | Admitting: Gastroenterology

## 2021-11-23 ENCOUNTER — Encounter: Payer: Self-pay | Admitting: Internal Medicine

## 2021-11-23 ENCOUNTER — Encounter: Payer: Self-pay | Admitting: Gastroenterology

## 2021-11-23 VITALS — BP 112/62 | HR 67 | Ht 68.0 in | Wt 144.0 lb

## 2021-11-23 DIAGNOSIS — Z227 Latent tuberculosis: Secondary | ICD-10-CM | POA: Diagnosis not present

## 2021-11-23 DIAGNOSIS — R140 Abdominal distension (gaseous): Secondary | ICD-10-CM

## 2021-11-23 DIAGNOSIS — R1010 Upper abdominal pain, unspecified: Secondary | ICD-10-CM | POA: Diagnosis not present

## 2021-11-23 MED ORDER — PANTOPRAZOLE SODIUM 40 MG PO TBEC: 40.0000 mg | DELAYED_RELEASE_TABLET | Freq: Every day | ORAL | 3 refills | Status: DC

## 2021-11-23 NOTE — Assessment & Plan Note (Signed)
He has been treated for presumed latent tuberculosis.  I told him that he does not need to be retested for tuberculosis and that his lifetime risk of developing active tuberculosis has decreased by well over 90%.  He can follow-up here as needed.

## 2021-11-23 NOTE — Progress Notes (Signed)
Referring Provider: Ardith Dark, MD Primary Care Physician:  Ardith Dark, MD   Reason for Consultation: IBS, history of GI bleed   IMPRESSION:  Upper abdominal pain, postprandial bloating, and early satiety x 5 years, recently worse.  No alarm features.  History of peptic ulcer disease. Etiology of ulcers remains unclear. No known history of H pylori or NSAIDs.  Symptoms preceded diagnosis of latent TB and treatment with isoniazid.  Recent symptoms have some overlap with those that he was experiencing prior to his hospitalization for bleeding peptic ulcer disease.  EGD recommended to evaluate for H. pylori, recurrent ulcers, gastric outlet changes, celiac and esophagitis.  If EGD is nondiagnostic would recommend abdominal ultrasound for further evaluation.  He carries a prior diagnosis of IBS.  Recent treatment with Bentyl provided no change in his symptoms.  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 male referred by Dr. Jimmey Ralph for further evaluation of IBS and a history of GI bleeding.  The history is obtained through the patient and review of his electronic health record.  He has a history of latent TB currently treated with isoniazid, IBS diagnosed by a GI doctor in [place], [place], and a history of peptic ulcer disease diagnosed 5 years ago when he was hospitalized with symptomatic anemia and blood in the stool. EGD and colonoscopy performed at that time.  No NSAID use at that time. No known history of H pylori.  He had multiple psychosocial stressors at time.   Ever since that hospitalization he has had postprandial bloating, early satiety and upper abdominal pain/cramping.  Symptoms frequently occur in the  morning and resolve after he has a bowel movement.  Although symptoms have been stable for several years he feels like they have worsened over the last couple of months.   Over the last several months he has also had early morning lower abdominal cramping that is described as a pulsation.  No recent blood or mucous in the stool. No change in bowel habits. No dark or black stools.  On rare occassions the symptoms will wake him in the morning. No systemic complaints. No extra-GI manifestations of IBD.    Has one bowel movement every morning. Feels like acidic foods worsen his IBS. He is avoiding coffee.   Pain is similar to symptoms around the time of his peptic ulcer disease and he is concerned that his ulcers may be back.   Butternut squash also causes a different abdominal pain.  Trial of Bentyl 20 mg 4 times daily did not change his symptoms. No OTC or herbal therapies.   Normal CBC 05/11/22, BUN 9 05/11/21  No abdominal imaging or endoscopic evaluation since his hospitalization in [place] 5 years ago  There is no known family history of colon cancer or polyps. No family history of stomach cancer or other GI malignancy. No family history of inflammatory bowel disease or celiac.    Past Medical History:  Diagnosis Date   History of stomach ulcers    IBS (irritable bowel syndrome)    Immunization, BCG     Past Surgical History:  Procedure Laterality Date   FETAL BLOOD TRANSFUSION      Current Outpatient Medications  Medication Sig Dispense Refill  ALTRENO 0.05 % LOTN Apply topically.     isoniazid (NYDRAZID) 300 MG tablet Take 1 tablet (300 mg total) by mouth daily. 30 tablet 3   vitamin B-6 (PYRIDOXINE) 25 MG tablet Take by mouth.     No current facility-administered medications for this visit.    Allergies as of 11/23/2021 - Review Complete 11/23/2021  Allergen Reaction Noted   Accutane [isotretinoin] Swelling 07/07/2021   Butternut [juglans cinerea]  07/07/2021    Other  08/25/2021    Family History  Problem Relation Age of Onset   Diabetes Maternal Grandmother    Prostate cancer Neg Hx     Social History   Socioeconomic History   Marital status: Married    Spouse name: Not on file   Number of children: Not on file   Years of education: Not on file   Highest education level: Not on file  Occupational History   Not on file  Tobacco Use   Smoking status: Never   Smokeless tobacco: Never  Vaping Use   Vaping Use: Never used  Substance and Sexual Activity   Alcohol use: Not Currently   Drug use: No   Sexual activity: Not on file  Other Topics Concern   Not on file  Social History Narrative   Not on file   Social Determinants of Health   Financial Resource Strain: Not on file  Food Insecurity: Not on file  Transportation Needs: Not on file  Physical Activity: Not on file  Stress: Not on file  Social Connections: Not on file  Intimate Partner Violence: Not on file    Review of Systems: 12 system ROS is negative except as noted above.   Physical Exam: General:   Alert,  well-nourished, pleasant and cooperative in NAD Head:  Normocephalic and atraumatic. Eyes:  Sclera clear, no icterus.   Conjunctiva pink. Ears:  Normal auditory acuity. Nose:  No deformity, discharge,  or lesions. Mouth:  No deformity or lesions.   Neck:  Supple; no masses or thyromegaly. Lungs:  Clear throughout to auscultation.   No wheezes. Heart:  Regular rate and rhythm; no murmurs. Abdomen:  Soft, thin, nontender, nondistended, normal bowel sounds, no rebound or guarding. No hepatosplenomegaly.  Pain localized to the left upper and mid upper abdomen.  I am unable to reproduce his pain on exam. Rectal:  Deferred  Msk:  Symmetrical. No boney deformities LAD: No inguinal or umbilical LAD Extremities:  No clubbing or edema. Neurologic:  Alert and  oriented x4;  grossly nonfocal Skin:  Intact without significant lesions or rashes. Psych:  Alert and  cooperative. Normal mood and affect.    Caleb Volkert L. Tarri Glenn, MD, MPH 11/23/2021, 1:27 PM

## 2021-11-23 NOTE — Patient Instructions (Addendum)
It was my pleasure to provide care to you today. Based on our discussion, I am providing you with my recommendations below:  RECOMMENDATION(S):   I have recommended an upper endoscopy for further evaluation or your symptoms and to look for recurrent ulcers.   I have recommended a trial of pantoprazole 40 mg every morning. Ideally, this medication should be taken 30-60 minutes prior to meals. My hope is that this will help with the pain, bloating, and filling up so fast when you eat.  If the endoscopy is negative, we will plan abdominal imaging.  In the meantime, avoid all non-steroidal anti-inflammatory medications.   ENDOSCOPY:   You have been scheduled for an endoscopy. Please follow written instructions given to you at your visit today.  INHALERS:   If you use inhalers (even only as needed), please bring them with you on the day of your procedure.  FOLLOW UP:  After your procedure, you will receive a call from my office staff regarding my recommendation for follow up.  BMI:  If you are age 20 or younger, your body mass index should be between 19-25. Your Body mass index is 21.9 kg/m. If this is out of the aformentioned range listed, please consider follow up with your Primary Care Provider.   MY CHART:  The [facility] providers would like to encourage you to use [facility] to communicate with providers for non-urgent requests or questions.  Due to long hold times on the telephone, sending your provider a message by [facility] may be a faster and more efficient way to get a response.  Please allow 48 business hours for a response.  Please remember that this is for non-urgent requests.   Thank you for trusting me with your gastrointestinal care!    Tressia Danas, MD, MPH

## 2021-11-23 NOTE — Progress Notes (Signed)
°  ° ° ° ° °  [facility]  Patient Active Problem List   Diagnosis Date Noted   IBS (irritable bowel syndrome) 08/25/2021   History of GI bleed 08/25/2021   Latent tuberculosis by blood test 09/12/2019    Patient's Medications  New Prescriptions   No medications on file  Previous Medications   ALTRENO 0.05 % LOTN    Apply topically.   PANTOPRAZOLE (PROTONIX) 40 MG TABLET    Take 1 tablet (40 mg total) by mouth daily.  Modified Medications   No medications on file  Discontinued Medications   ISONIAZID (NYDRAZID) 300 MG TABLET    Take 1 tablet (300 mg total) by mouth daily.   VITAMIN B-6 (PYRIDOXINE) 25 MG TABLET    Take by mouth.    Subjective: Caleb Warner is in for his routine follow-up visit.  He will complete 6 weeks of INH therapy for presumed latent tuberculosis later this week.  He had been having cramps at the time of his last visit but figured out that this was due to his irritable bowel syndrome and not his medication.  He feels like he has tolerated INH well.  Review of Systems: Review of Systems  Gastrointestinal:  Negative for abdominal pain, nausea and vomiting.   Past Medical History:  Diagnosis Date   History of stomach ulcers    IBS (irritable bowel syndrome)    Immunization, BCG     Social History   Tobacco Use   Smoking status: Never   Smokeless tobacco: Never  Vaping Use   Vaping Use: Never used  Substance Use Topics   Alcohol use: Not Currently   Drug use: No    Family History  Problem Relation Age of Onset   Diabetes Maternal Grandmother    Prostate cancer Neg Hx     Allergies  Allergen Reactions   Accutane [Isotretinoin] Swelling   Butternut [Juglans Cinerea]     "Butternut squash causes abdominal pain.    Other     Objective: Vitals:   11/23/21 1511  BP: 112/73  Pulse: 85  Temp: 98 F (36.7 C)  TempSrc: Temporal  SpO2: 97%  Weight: 145 lb (65.8 kg)   Body mass index is 22.05 kg/m.  Physical  Exam Constitutional:      Comments: His spirits are good.  Cardiovascular:     Rate and Rhythm: Normal rate.  Pulmonary:     Effort: Pulmonary effort is normal.  Psychiatric:        Mood and Affect: Mood normal.    Lab Results    Problem List Items Addressed This Visit       Unprioritized   Latent tuberculosis by blood test    He has been treated for presumed latent tuberculosis.  I told him that he does not need to be retested for tuberculosis and that his lifetime risk of developing active tuberculosis has decreased by well over 90%.  He can follow-up here as needed.        Cliffton Asters, MD [facility] [phone] pager   [phone] cell 11/23/2021, 3:43 PM

## 2021-12-08 ENCOUNTER — Other Ambulatory Visit: Payer: Self-pay | Admitting: Internal Medicine

## 2021-12-08 DIAGNOSIS — Z227 Latent tuberculosis: Secondary | ICD-10-CM

## 2021-12-16 ENCOUNTER — Ambulatory Visit (AMBULATORY_SURGERY_CENTER): Payer: BC Managed Care – PPO | Admitting: Gastroenterology

## 2021-12-16 ENCOUNTER — Encounter: Payer: Self-pay | Admitting: Gastroenterology

## 2021-12-16 ENCOUNTER — Other Ambulatory Visit: Payer: Self-pay | Admitting: Gastroenterology

## 2021-12-16 VITALS — BP 108/63 | HR 88 | Temp 99.1°F | Resp 22 | Ht 68.0 in | Wt 144.0 lb

## 2021-12-16 DIAGNOSIS — R6881 Early satiety: Secondary | ICD-10-CM

## 2021-12-16 DIAGNOSIS — R1010 Upper abdominal pain, unspecified: Secondary | ICD-10-CM

## 2021-12-16 DIAGNOSIS — R140 Abdominal distension (gaseous): Secondary | ICD-10-CM

## 2021-12-16 MED ORDER — SODIUM CHLORIDE 0.9 % IV SOLN: 500.0000 mL | Freq: Once | INTRAVENOUS | Status: DC

## 2021-12-16 NOTE — Op Note (Signed)
[facility] ?Patient Name: Caleb Warner ?Procedure Date: 12/16/2021 3:01 PM ?MRN: [id number] ?Endoscopist: Tressia Danas MD, MD ?Age: 36 ?Referring MD:  ?Date of Birth: 17-Oct-1985 ?Gender: Male ?Account #: [id number] ?Procedure:                Upper GI endoscopy ?Indications:              Upper abdominal pain, Abdominal bloating, Early  ?                          satiety ?Medicines:                Monitored Anesthesia Care ?Procedure:                Pre-Anesthesia Assessment: ?                          - Prior to the procedure, a History and Physical  ?                          was performed, and patient medications and  ?                          allergies were reviewed. The patient's tolerance of  ?                          previous anesthesia was also reviewed. The risks  ?                          and benefits of the procedure and the sedation  ?                          options and risks were discussed with the patient.  ?                          All questions were answered, and informed consent  ?                          was obtained. Prior Anticoagulants: The patient has  ?                          taken no previous anticoagulant or antiplatelet  ?                          agents. ASA Grade Assessment: I - A normal, healthy  ?                          patient. After reviewing the risks and benefits,  ?                          the patient was deemed in satisfactory condition to  ?                          undergo the procedure. ?  After obtaining informed consent, the endoscope was  ?                          passed under direct vision. Throughout the  ?                          procedure, the patient's blood pressure, pulse, and  ?                          oxygen saturations were monitored continuously. The  ?                          GIF [id number] #[id number] was introduced through the  ?                          mouth, and advanced to the third part of duodenum.  ?                           The upper GI endoscopy was accomplished without  ?                          difficulty. The patient tolerated the procedure  ?                          well. ?Scope In: ?Scope Out: ?Findings:                 The examined esophagus was normal. The z-line is  ?                          located 36 cm from the incisors. Biopsies were  ?                          taken from the mid/proximal and distal esophagus  ?                          with a cold forceps for histology. Estimated blood  ?                          loss was minimal. ?                          Patchy mildly erythematous mucosa without bleeding  ?                          was found in the gastric body. Biopsies were taken  ?                          from the antrum, body, and fundus with a cold  ?                          forceps for histology. Estimated blood loss was  ?  minimal. ?                          The examined duodenum was normal. Biopsies were  ?                          taken with a cold forceps for histology. Estimated  ?                          blood loss was minimal. ?Complications:            No immediate complications. ?Estimated Blood Loss:     Estimated blood loss was minimal. ?Impression:               - Normal esophagus. Biopsied. ?                          - Erythematous mucosa in the gastric body. Biopsied. ?                          - Normal examined duodenum. Biopsied. ?Recommendation:           - Patient has a contact number available for  ?                          emergencies. The signs and symptoms of potential  ?                          delayed complications were discussed with the  ?                          patient. Return to normal activities tomorrow.  ?                          Written discharge instructions were provided to the  ?                          patient. ?                          - Resume previous diet. ?                          - Continue present medications. ?                           - Await pathology results. ?                          - No aspirin, ibuprofen, naproxen, or other  ?                          non-steroidal anti-inflammatory drugs. ?Tressia Danas MD, MD ?12/16/2021 3:22:56 PM ?This report has been signed electronically. ?

## 2021-12-16 NOTE — Progress Notes (Signed)
VSS, transported to PACU °

## 2021-12-16 NOTE — Progress Notes (Signed)
VS  DT ? ?Pt's states no medical or surgical changes since previsit or office visit. ? ?

## 2021-12-16 NOTE — Progress Notes (Signed)
Indication for EGD: Upper abdominal pain, postprandial bloating, and early satiety x 5 years, recently worse ? ?Please see my 11/23/21 office note for complete details. There has been no change in history or physical exam. The patient remains an appropriate candidate for monitored anesthesia care in the [facility].  ?

## 2021-12-16 NOTE — Progress Notes (Signed)
Called to room to assist during endoscopic procedure.  Patient ID and intended procedure confirmed with present staff. Received instructions for my participation in the procedure from the performing physician.  

## 2021-12-16 NOTE — Patient Instructions (Signed)
Resume previous diet and medications. Awaiting pathology results. No aspirin, Ibuprofen, or other non-steroidal anti-inflammatory drugs. ? ?YOU HAD AN ENDOSCOPIC PROCEDURE TODAY AT THE [facility]:   Refer to the procedure report that was given to you for any specific questions about what was found during the examination.  If the procedure report does not answer your questions, please call your gastroenterologist to clarify.  If you requested that your care partner not be given the details of your procedure findings, then the procedure report has been included in a sealed envelope for you to review at your convenience later. ? ?YOU SHOULD EXPECT: Some feelings of bloating in the abdomen. Passage of more gas than usual.  Walking can help get rid of the air that was put into your GI tract during the procedure and reduce the bloating. If you had a lower endoscopy (such as a colonoscopy or flexible sigmoidoscopy) you may notice spotting of blood in your stool or on the toilet paper. If you underwent a bowel prep for your procedure, you may not have a normal bowel movement for a few days. ? ?Please Note:  You might notice some irritation and congestion in your nose or some drainage.  This is from the oxygen used during your procedure.  There is no need for concern and it should clear up in a day or so. ? ?SYMPTOMS TO REPORT IMMEDIATELY: ? ?Following upper endoscopy (EGD) ? Vomiting of blood or coffee ground material ? New chest pain or pain under the shoulder blades ? Painful or persistently difficult swallowing ? New shortness of breath ? Fever of 100?F or higher ? Black, tarry-looking stools ? ?For urgent or emergent issues, a gastroenterologist can be reached at any hour by calling [phone]. ?Do not use MyChart messaging for urgent concerns.  ? ? ?DIET:  We do recommend a small meal at first, but then you may proceed to your regular diet.  Drink plenty of fluids but you should avoid alcoholic  beverages for 24 hours. ? ?ACTIVITY:  You should plan to take it easy for the rest of today and you should NOT DRIVE or use heavy machinery until tomorrow (because of the sedation medicines used during the test).   ? ?FOLLOW UP: ?Our staff will call the number listed on your records 48-72 hours following your procedure to check on you and address any questions or concerns that you may have regarding the information given to you following your procedure. If we do not reach you, we will leave a message.  We will attempt to reach you two times.  During this call, we will ask if you have developed any symptoms of COVID 19. If you develop any symptoms (ie: fever, flu-like symptoms, shortness of breath, cough etc.) before then, please call [phone].  If you test positive for Covid 19 in the 2 weeks post procedure, please call and report this information to [place].   ? ?If any biopsies were taken you will be contacted by phone or by letter within the next 1-3 weeks.  Please call us at [phone] if you have not heard about the biopsies in 3 weeks.  ? ? ?SIGNATURES/CONFIDENTIALITY: ?You and/or your care partner have signed paperwork which will be entered into your electronic medical record.  These signatures attest to the fact that that the information above on your After Visit Summary has been reviewed and is understood.  Full responsibility of the confidentiality of this discharge information lies with you and/or  your care-partner.  ?

## 2021-12-20 ENCOUNTER — Telehealth: Payer: Self-pay | Admitting: *Deleted

## 2021-12-20 ENCOUNTER — Telehealth: Payer: Self-pay

## 2021-12-20 NOTE — Telephone Encounter (Signed)
Attempted to call patient for their post-procedure follow-up call. No answer. Left voicemail.   

## 2021-12-20 NOTE — Telephone Encounter (Signed)
No answer on 2nd follow up call. 

## 2021-12-22 ENCOUNTER — Other Ambulatory Visit: Payer: Self-pay

## 2021-12-22 DIAGNOSIS — R140 Abdominal distension (gaseous): Secondary | ICD-10-CM

## 2021-12-22 DIAGNOSIS — R1010 Upper abdominal pain, unspecified: Secondary | ICD-10-CM

## 2022-01-10 ENCOUNTER — Ambulatory Visit (HOSPITAL_COMMUNITY)
Admission: RE | Admit: 2022-01-10 | Discharge: 2022-01-10 | Disposition: A | Discharge status: Home / Self Care | Payer: BC Managed Care – PPO | Source: Ambulatory Visit | Attending: Gastroenterology | Admitting: Gastroenterology

## 2022-01-10 DIAGNOSIS — R1010 Upper abdominal pain, unspecified: Secondary | ICD-10-CM | POA: Diagnosis present

## 2022-01-10 DIAGNOSIS — R140 Abdominal distension (gaseous): Secondary | ICD-10-CM | POA: Diagnosis present

## 2022-01-21 ENCOUNTER — Encounter: Payer: BC Managed Care – PPO | Admitting: Family Medicine

## 2022-02-04 ENCOUNTER — Ambulatory Visit: Payer: BC Managed Care – PPO | Admitting: Family Medicine

## 2022-02-04 ENCOUNTER — Encounter: Payer: Self-pay | Admitting: Family Medicine

## 2022-02-04 VITALS — BP 120/84 | HR 94 | Temp 98.1°F | Ht 68.0 in | Wt 142.2 lb

## 2022-02-04 DIAGNOSIS — J309 Allergic rhinitis, unspecified: Secondary | ICD-10-CM

## 2022-02-04 DIAGNOSIS — L719 Rosacea, unspecified: Secondary | ICD-10-CM | POA: Diagnosis not present

## 2022-02-04 DIAGNOSIS — Z0001 Encounter for general adult medical examination with abnormal findings: Secondary | ICD-10-CM

## 2022-02-04 DIAGNOSIS — Z227 Latent tuberculosis: Secondary | ICD-10-CM | POA: Diagnosis not present

## 2022-02-04 DIAGNOSIS — J3089 Other allergic rhinitis: Secondary | ICD-10-CM | POA: Insufficient documentation

## 2022-02-04 MED ORDER — METRONIDAZOLE 0.75 % EX GEL: 1.0000 "application " | Freq: Two times a day (BID) | CUTANEOUS | 0 refills | Status: DC

## 2022-02-04 MED ORDER — MONTELUKAST SODIUM 10 MG PO TABS: 10.0000 mg | ORAL_TABLET | Freq: Every day | ORAL | 3 refills | Status: DC

## 2022-02-04 NOTE — Assessment & Plan Note (Signed)
Worsening recently.  Not having much improvement with Xyzal.  We will add on Singulair.  He can use over-the-counter Pataday drops. ?

## 2022-02-04 NOTE — Progress Notes (Signed)
? ?Chief Complaint:  ?Caleb Warner is a 36 y.o. male who presents today for his annual comprehensive physical exam.   ? ?Assessment/Plan:  ?Chronic Problems Addressed Today: ?Allergic rhinitis ?Worsening recently.  Not having much improvement with Xyzal.  We will add on Singulair.  He can use over-the-counter Pataday drops. ? ?Rosacea ?Has previously followed with dermatology but they left and he is looking for a new dermatologist.  Currently having a flare.  We will start topical MetroGel.  He will need to follow-up with new dermatologist soon. ? ?Latent tuberculosis by blood test ?He has completed treatment course per ID.  He needs TB test for a wellness form.  We will get a chest x-ray. ? ?Preventative Healthcare: ?UTD on vaccines and screenings.  ? ?Patient Counseling(The following topics were reviewed and/or handout was given): ? -Nutrition: Stressed importance of moderation in sodium/caffeine intake, saturated fat and cholesterol, caloric balance, sufficient intake of fresh fruits, vegetables, and fiber. ? -Stressed the importance of regular exercise.  ? -Substance Abuse: Discussed cessation/primary prevention of tobacco, alcohol, or other drug use; driving or other dangerous activities under the influence; availability of treatment for abuse.  ? -Injury prevention: Discussed safety belts, safety helmets, smoke detector, smoking near bedding or upholstery.  ? -Sexuality: Discussed sexually transmitted diseases, partner selection, use of condoms, avoidance of unintended pregnancy and contraceptive alternatives.  ? -Dental health: Discussed importance of regular tooth brushing, flossing, and dental visits. ? -Health maintenance and immunizations reviewed. Please refer to Health maintenance section. ? ?Return to care in 1 year for next preventative visit.  ? ?  ?Subjective:  ?HPI: ? ?He has no acute complaints today.  ? ?See A/p for status of chronic conditions.  ? ? ?  08/25/2021  ?  8:04 AM   ?Depression screen PHQ 2/9  ?Decreased Interest 0  ?Down, Depressed, Hopeless 0  ?PHQ - 2 Score 0  ? ?ROS: Per HPI, otherwise a complete review of systems was negative.  ? ?PMH: ? ?The following were reviewed and entered/updated in epic: ?Past Medical History:  ?Diagnosis Date  ? History of stomach ulcers   ? IBS (irritable bowel syndrome)   ? Immunization, BCG   ? ?Patient Active Problem List  ? Diagnosis Date Noted  ? Rosacea 02/04/2022  ? Allergic rhinitis 02/04/2022  ? IBS (irritable bowel syndrome) 08/25/2021  ? History of GI bleed 08/25/2021  ? Latent tuberculosis by blood test 09/12/2019  ? ?Past Surgical History:  ?Procedure Laterality Date  ? FETAL BLOOD TRANSFUSION    ? ? ?Family History  ?Problem Relation Age of Onset  ? Diabetes Maternal Grandmother   ? Prostate cancer Neg Hx   ? ? ?Medications- reviewed and updated ?Current Outpatient Medications  ?Medication Sig Dispense Refill  ? ALTRENO 0.05 % LOTN Apply topically.    ? levocetirizine (XYZAL) 5 MG tablet Take 5 mg by mouth at bedtime.    ? metroNIDAZOLE (METROGEL) 0.75 % gel Apply 1 application. topically 2 (two) times daily. 45 g 0  ? montelukast (SINGULAIR) 10 MG tablet Take 1 tablet (10 mg total) by mouth at bedtime. 30 tablet 3  ? pantoprazole (PROTONIX) 40 MG tablet Take 1 tablet (40 mg total) by mouth daily. 90 tablet 3  ? ?No current facility-administered medications for this visit.  ? ? ?Allergies-reviewed and updated ?Allergies  ?Allergen Reactions  ? Accutane [Isotretinoin] Swelling  ? Butternut [Butternut (Juglans Cinerea)]   ?  "Butternut squash causes abdominal pain.   ? Other   ? ? ?  Social History  ? ?Socioeconomic History  ? Marital status: Married  ?  Spouse name: Not on file  ? Number of children: Not on file  ? Years of education: Not on file  ? Highest education level: Not on file  ?Occupational History  ? Not on file  ?Tobacco Use  ? Smoking status: Never  ? Smokeless tobacco: Never  ?Vaping Use  ? Vaping Use: Never used   ?Substance and Sexual Activity  ? Alcohol use: Not Currently  ? Drug use: No  ? Sexual activity: Not on file  ?Other Topics Concern  ? Not on file  ?Social History Narrative  ? Not on file  ? ?Social Determinants of Health  ? ?Financial Resource Strain: Not on file  ?Food Insecurity: Not on file  ?Transportation Needs: Not on file  ?Physical Activity: Not on file  ?Stress: Not on file  ?Social Connections: Not on file  ? ?   ?  ?Objective:  ?Physical Exam: ?BP 120/84 (BP Location: Right Arm)   Pulse 94   Temp 98.1 ?F (36.7 ?C) (Temporal)   Ht 5\' 8"  (1.727 m)   Wt 142 lb 3.2 oz (64.5 kg)   SpO2 98%   BMI 21.62 kg/m?   ?Body mass index is 21.62 kg/m?. ?Wt Readings from Last 3 Encounters:  ?02/04/22 142 lb 3.2 oz (64.5 kg)  ?12/16/21 144 lb (65.3 kg)  ?11/23/21 145 lb (65.8 kg)  ? ?Gen: NAD, resting comfortably ?HEENT: TMs normal bilaterally. OP clear. No thyromegaly noted.  ?CV: RRR with no murmurs appreciated ?Pulm: NWOB, CTAB with no crackles, wheezes, or rhonchi ?GI: Normal bowel sounds present. Soft, Nontender, Nondistended. ?MSK: no edema, cyanosis, or clubbing noted ?Skin: warm, dry ?Neuro: CN2-12 grossly intact. Strength 5/5 in upper and lower extremities. Reflexes symmetric and intact bilaterally.  ?Psych: Normal affect and thought content ?   ? ?11/25/21. Katina Degree, MD ?02/04/2022 9:36 AM  ?

## 2022-02-04 NOTE — Patient Instructions (Signed)
It was very nice to see you today! ? ?We will get an x-ray but will likely complete your form. ? ?Please try using Pataday drops for your eyes.  Please also start the Singulair. ? ?Please try the MetroGel to help with your rosacea. ? ?I will see back in year for your next annual checkup.  Please come back to see me sooner if needed. ? ?Take care, ?Dr Jerline Pain ? ?PLEASE NOTE: ? ?If you had any lab tests please let us know if you have not heard back within a few days. You may see your results on mychart before we have a chance to review them but we will give you a call once they are reviewed by [place]. If we ordered any referrals today, please let us know if you have not heard from their office within the next week.  ? ?Please try these tips to maintain a healthy lifestyle: ? ?Eat at least 3 REAL meals and 1-2 snacks per day.  Aim for no more than 5 hours between eating.  If you eat breakfast, please do so within one hour of getting up.  ? ?Each meal should contain half fruits/vegetables, one quarter protein, and one quarter carbs (no bigger than a computer mouse) ? ?Cut down on sweet beverages. This includes juice, soda, and sweet tea.  ? ?Drink at least 1 glass of water with each meal and aim for at least 8 glasses per day ? ?Exercise at least 150 minutes every week.   ? ?Preventive Care 67-10 Years Old, Male ?Preventive care refers to lifestyle choices and visits with your health care provider that can promote health and wellness. Preventive care visits are also called wellness exams. ?What can I expect for my preventive care visit? ?Counseling ?During your preventive care visit, your health care provider may ask about your: ?Medical history, including: ?Past medical problems. ?Family medical history. ?Current health, including: ?Emotional well-being. ?Home life and relationship well-being. ?Sexual activity. ?Lifestyle, including: ?Alcohol, nicotine or tobacco, and drug use. ?Access to firearms. ?Diet, exercise, and  sleep habits. ?Safety issues such as seatbelt and bike helmet use. ?Sunscreen use. ?Work and work Statistician. ?Physical exam ?Your health care provider may check your: ?Height and weight. These may be used to calculate your BMI (body mass index). BMI is a measurement that tells if you are at a healthy weight. ?Waist circumference. This measures the distance around your waistline. This measurement also tells if you are at a healthy weight and may help predict your risk of certain diseases, such as type 2 diabetes and high blood pressure. ?Heart rate and blood pressure. ?Body temperature. ? ?Vaccines are usually given at various ages, according to a schedule. Your health care provider will recommend vaccines for you based on your age, medical history, and lifestyle or other factors, such as travel or where you work. ?What tests do I need? ?Screening ?Your health care provider may recommend screening tests for certain conditions. This may include: ?Lipid and cholesterol levels. ?Diabetes screening. This is done by checking your blood sugar (glucose) after you have not eaten for a while (fasting). ?Hepatitis B test. ?Hepatitis C test. ?HIV (human immunodeficiency virus) test. ?STI (sexually transmitted infection) testing, if you are at risk. ?Talk with your health care provider about your test results, treatment options, and if necessary, the need for more tests. ?Follow these instructions at home: ?Eating and drinking ? ?Eat a healthy diet that includes fresh fruits and vegetables, whole grains, lean protein, and low-fat dairy  products. ?Drink enough fluid to keep your urine pale yellow. ?Take vitamin and mineral supplements as recommended by your health care provider. ?Do not drink alcohol if your health care provider tells you not to drink. ?If you drink alcohol: ?Limit how much you have to 0-2 drinks a day. ?Know how much alcohol is in your drink. In the [place] one drink equals one 12 oz bottle of beer (355 mL), one  5 oz glass of wine (148 mL), or one 1? oz glass of hard liquor (44 mL). ?Lifestyle ?Brush your teeth every morning and night with fluoride toothpaste. Floss one time each day. ?Exercise for at least 30 minutes 5 or more days each week. ?Do not use any products that contain nicotine or tobacco. These products include cigarettes, chewing tobacco, and vaping devices, such as e-cigarettes. If you need help quitting, ask your health care provider. ?Do not use drugs. ?If you are sexually active, practice safe sex. Use a condom or other form of protection to prevent STIs. ?Find healthy ways to manage stress, such as: ?Meditation, yoga, or listening to music. ?Journaling. ?Talking to a trusted person. ?Spending time with friends and family. ?Minimize exposure to UV radiation to reduce your risk of skin cancer. ?Safety ?Always wear your seat belt while driving or riding in a vehicle. ?Do not drive: ?If you have been drinking alcohol. Do not ride with someone who has been drinking. ?If you have been using any mind-altering substances or drugs. ?While texting. ?When you are tired or distracted. ?Wear a helmet and other protective equipment during sports activities. ?If you have firearms in your house, make sure you follow all gun safety procedures. ?Seek help if you have been physically or sexually abused. ?What's next? ?Go to your health care provider once a year for an annual wellness visit. ?Ask your health care provider how often you should have your eyes and teeth checked. ?Stay up to date on all vaccines. ?This information is not intended to replace advice given to you by your health care provider. Make sure you discuss any questions you have with your health care provider. ?Document Revised: 03/17/2021 Document Reviewed: 03/17/2021 ?Elsevier Patient Education ? [place]. ? ?

## 2022-02-04 NOTE — Assessment & Plan Note (Signed)
Has previously followed with dermatology but they left and he is looking for a new dermatologist.  Currently having a flare.  We will start topical MetroGel.  He will need to follow-up with new dermatologist soon. ?

## 2022-02-04 NOTE — Assessment & Plan Note (Signed)
He has completed treatment course per ID.  He needs TB test for a wellness form.  We will get a chest x-ray. ?

## 2022-02-15 ENCOUNTER — Ambulatory Visit (INDEPENDENT_AMBULATORY_CARE_PROVIDER_SITE_OTHER)
Admission: RE | Admit: 2022-02-15 | Discharge: 2022-02-15 | Disposition: A | Discharge status: Home / Self Care | Payer: BC Managed Care – PPO | Source: Ambulatory Visit | Attending: Family Medicine | Admitting: Family Medicine

## 2022-02-15 DIAGNOSIS — Z227 Latent tuberculosis: Secondary | ICD-10-CM | POA: Diagnosis not present

## 2022-02-17 NOTE — Progress Notes (Signed)
Please inform patient of the following:  Good news!  His chest x-ray is normal.  No signs of tuberculosis.  We can fill out his wellness form.

## 2022-03-01 ENCOUNTER — Telehealth: Payer: Self-pay

## 2022-03-01 NOTE — Telephone Encounter (Signed)
Placed on PCP office to be sign  

## 2022-03-01 NOTE — Telephone Encounter (Signed)
Patient is calling in requesting to know when CPE form will be complete.  States Dr. Jimmey Ralph needed to sign off on the form after x ray was complete.   Please follow up with patient in regard.

## 2022-03-01 NOTE — Telephone Encounter (Signed)
LVM form ready to be pick up  Placed at front office  Copy done and placed to be scan on patient chart

## 2022-03-03 ENCOUNTER — Encounter: Payer: Self-pay | Admitting: Family Medicine

## 2022-06-27 ENCOUNTER — Encounter: Payer: Self-pay | Admitting: *Deleted

## 2022-07-28 ENCOUNTER — Encounter: Payer: Self-pay | Admitting: Family Medicine

## 2022-07-28 ENCOUNTER — Ambulatory Visit (INDEPENDENT_AMBULATORY_CARE_PROVIDER_SITE_OTHER): Payer: BC Managed Care – PPO | Admitting: Family Medicine

## 2022-07-28 VITALS — BP 129/75 | HR 105 | Temp 97.5°F | Resp 98 | Ht 68.0 in | Wt 147.6 lb

## 2022-07-28 DIAGNOSIS — J029 Acute pharyngitis, unspecified: Secondary | ICD-10-CM

## 2022-07-28 DIAGNOSIS — J309 Allergic rhinitis, unspecified: Secondary | ICD-10-CM | POA: Diagnosis not present

## 2022-07-28 DIAGNOSIS — L509 Urticaria, unspecified: Secondary | ICD-10-CM | POA: Diagnosis not present

## 2022-07-28 LAB — POCT INFLUENZA A/B
Influenza A, POC: NEGATIVE
Influenza B, POC: NEGATIVE

## 2022-07-28 LAB — POCT RAPID STREP A (OFFICE): Rapid Strep A Screen: POSITIVE — AB

## 2022-07-28 LAB — POC COVID19 BINAXNOW: SARS Coronavirus 2 Ag: NEGATIVE

## 2022-07-28 MED ORDER — AZELASTINE HCL 0.1 % NA SOLN: 2.0000 | Freq: Two times a day (BID) | NASAL | 12 refills | Status: DC

## 2022-07-28 MED ORDER — AMOXICILLIN 875 MG PO TABS: 875.0000 mg | ORAL_TABLET | Freq: Two times a day (BID) | ORAL | 0 refills | Status: DC

## 2022-07-28 MED ORDER — BENZONATATE 200 MG PO CAPS: 200.0000 mg | ORAL_CAPSULE | Freq: Two times a day (BID) | ORAL | 0 refills | Status: DC | PRN

## 2022-07-28 MED ORDER — LEVOCETIRIZINE DIHYDROCHLORIDE 5 MG PO TABS: 10.0000 mg | ORAL_TABLET | Freq: Every day | ORAL | 3 refills | Status: AC

## 2022-07-28 NOTE — Assessment & Plan Note (Signed)
Continue Singulair.  Will increase dose of Xyzal as above.  Add on Astelin.  He will let us know if this continues to be an issue and we can refer him to see the allergist.

## 2022-07-28 NOTE — Assessment & Plan Note (Signed)
Has been on Xyzal 5 mg daily and Singulair 10 mg daily however symptoms have flared up recently.  We will increase Xyzal to 10 mg daily.  May consider allergy testing at some point in the future if this continues to be an issue.

## 2022-07-28 NOTE — Patient Instructions (Signed)
It was very nice to see you today!  Your rapid strep test is positive.  Please start the amoxicillin.  Please also start the Astelin and Tessalon.  I will send a prescription in for Xyzal to help with your dermatographia and urticaria.  Let me know if your symptoms or not improving by next week.  Take care, Dr Jerline Pain  PLEASE NOTE:  If you had any lab tests please let us know if you have not heard back within a few days. You may see your results on mychart before we have a chance to review them but we will give you a call once they are reviewed by [place]. If we ordered any referrals today, please let us know if you have not heard from their office within the next week.   Please try these tips to maintain a healthy lifestyle:  Eat at least 3 REAL meals and 1-2 snacks per day.  Aim for no more than 5 hours between eating.  If you eat breakfast, please do so within one hour of getting up.   Each meal should contain half fruits/vegetables, one quarter protein, and one quarter carbs (no bigger than a computer mouse)  Cut down on sweet beverages. This includes juice, soda, and sweet tea.   Drink at least 1 glass of water with each meal and aim for at least 8 glasses per day  Exercise at least 150 minutes every week.

## 2022-07-28 NOTE — Progress Notes (Signed)
   Caleb Warner is a 36 y.o. male who presents today for an office visit.  Assessment/Plan:  New/Acute Problems: Sore Throat Rapid strep positive.  Start amoxicillin.  Also start Astelin and Tessalon.  Encouraged hydration.  He can continue over-the-counter meds.  We discussed reasons to return to care.  Follow-up as needed.  Chronic Problems Addressed Today: Urticaria Has been on Xyzal 5 mg daily and Singulair 10 mg daily however symptoms have flared up recently.  We will increase Xyzal to 10 mg daily.  May consider allergy testing at some point in the future if this continues to be an issue.  Allergic rhinitis Continue Singulair.  Will increase dose of Xyzal as above.  Add on Astelin.  He will let us know if this continues to be an issue and we can refer him to see the allergist.     Subjective:  HPI:  Patient here with sore throat. Started a couple of days ago with a scratchy throat, chills, lethargy, post nasal drip. Tried taking tylenol without much improvement. Tried taking OTC cold medications with modest improvement. 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       Objective:  Physical Exam: BP 129/75   Pulse (!) 105   Temp (!) 97.5 F (36.4 C) (Temporal)   Resp (!) 98   Ht 5\' 8"  (1.727 m)   Wt 147 lb 9.6 oz (67 kg)   BMI 22.44 kg/m   Gen: No acute distress, resting comfortably HEENT: OP erythematous.  Nasal mucosa erythematous with discharge. TMs clear. CV: Regular rate and rhythm with no murmurs appreciated Pulm: Normal work of breathing, clear to auscultation bilaterally with no crackles, wheezes, or rhonchi Neuro: Grossly normal, moves all extremities Psych: Normal affect and thought content      Jorja Empie M. Jerline Pain, MD 07/28/2022 10:43 AM

## 2022-11-11 ENCOUNTER — Ambulatory Visit: Payer: BC Managed Care – PPO | Admitting: Family Medicine

## 2022-11-11 ENCOUNTER — Encounter: Payer: Self-pay | Admitting: Family Medicine

## 2022-11-11 VITALS — BP 106/71 | HR 84 | Temp 97.3°F | Ht 68.0 in | Wt 147.8 lb

## 2022-11-11 DIAGNOSIS — K589 Irritable bowel syndrome without diarrhea: Secondary | ICD-10-CM

## 2022-11-11 DIAGNOSIS — J029 Acute pharyngitis, unspecified: Secondary | ICD-10-CM

## 2022-11-11 LAB — POC COVID19 BINAXNOW: SARS Coronavirus 2 Ag: POSITIVE — AB

## 2022-11-11 LAB — POCT RAPID STREP A (OFFICE): Rapid Strep A Screen: POSITIVE — AB

## 2022-11-11 MED ORDER — PANTOPRAZOLE SODIUM 40 MG PO TBEC: 40.0000 mg | DELAYED_RELEASE_TABLET | Freq: Every day | ORAL | 3 refills | Status: DC

## 2022-11-11 MED ORDER — AMOXICILLIN 875 MG PO TABS: 875.0000 mg | ORAL_TABLET | Freq: Two times a day (BID) | ORAL | 0 refills | Status: AC

## 2022-11-11 MED ORDER — NIRMATRELVIR/RITONAVIR (PAXLOVID)TABLET: 3.0000 | ORAL_TABLET | Freq: Two times a day (BID) | ORAL | 0 refills | Status: AC

## 2022-11-11 MED ORDER — DICYCLOMINE HCL 20 MG PO TABS: 20.0000 mg | ORAL_TABLET | Freq: Three times a day (TID) | ORAL | 3 refills | Status: DC

## 2022-11-11 NOTE — Patient Instructions (Signed)
It was very nice to see you today!  Your strep and COVID test were both positive.  Please start the paxlovid and amoxicillin.  Try the Bentyl for your IBS.  Please make sure that you are getting plenty of fluids.  Let me know if not improving by next week.  Take care, Dr Jerline Pain  PLEASE NOTE:  If you had any lab tests, please let us know if you have not heard back within a few days. You may see your results on mychart before we have a chance to review them but we will give you a call once they are reviewed by [place].   If we ordered any referrals today, please let us know if you have not heard from their office within the next week.   If you had any urgent prescriptions sent in today, please check with the pharmacy within an hour of our visit to make sure the prescription was transmitted appropriately.   Please try these tips to maintain a healthy lifestyle:  Eat at least 3 REAL meals and 1-2 snacks per day.  Aim for no more than 5 hours between eating.  If you eat breakfast, please do so within one hour of getting up.   Each meal should contain half fruits/vegetables, one quarter protein, and one quarter carbs (no bigger than a computer mouse)  Cut down on sweet beverages. This includes juice, soda, and sweet tea.   Drink at least 1 glass of water with each meal and aim for at least 8 glasses per day  Exercise at least 150 minutes every week.

## 2022-11-11 NOTE — Assessment & Plan Note (Signed)
Symptoms are not controlled.  He is having more diarrhea predominant symptoms.  He had GI workup last year which was negative.  He would like to restart medications.  We will start Bentyl.  We did this for him a little over a year ago however he is not sure how well this worked.  He can also try over-the-counter Imodium.  He will follow-up in a couple of weeks to let me this is working.  May consider trial of amitriptyline or other antidepressant if not improving.

## 2022-11-11 NOTE — Progress Notes (Signed)
   Caleb Warner is a 37 y.o. male who presents today for an office visit.  Assessment/Plan:  New/Acute Problems: COVID / Strep Throat Rapid covid and strep test both positive.  No red flags.  No signs of respiratory distress.  We will start paxlovid and amoxicillin.  Encouraged hydration.  He can continue over-the-counter meds as needed.  He will let me know if not improving.  We discussed reasons return to care.  Follow-up as needed.  Chronic Problems Addressed Today: IBS (irritable bowel syndrome) Symptoms are not controlled.  He is having more diarrhea predominant symptoms.  He had GI workup last year which was negative.  He would like to restart medications.  We will start Bentyl.  We did this for him a little over a year ago however he is not sure how well this worked.  He can also try over-the-counter Imodium.  He will follow-up in a couple of weeks to let me this is working.  May consider trial of amitriptyline or other antidepressant if not improving.     Subjective:  HPI:  See A/p for status of chronic conditions.  Main concern today is cough, congestion, sore throat, and chills.  This started yesterday. 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 No chest pain or shortness of breath. He has noticed some blood mixed in his sputum.   He has also been having some issues with worsening IBS recently. He has had a GI workup a year ago which was negative. He is having more loose stools and bloating. HE hsa been on several medications in the past and would like to restart.        Objective:  Physical Exam: BP 106/71   Pulse 84   Temp (!) 97.3 F (36.3 C) (Temporal)   Ht 5\' 8"  (1.727 m)   Wt 147 lb 12.8 oz (67 kg)   SpO2 98%   BMI 22.47 kg/m   Gen: No acute distress, resting comfortably HEENT: OP erythematous.  No exudates. CV: Regular rate and rhythm with no murmurs appreciated Pulm: Normal work of breathing, clear to auscultation bilaterally with no  crackles, wheezes, or rhonchi Neuro: Grossly normal, moves all extremities Psych: Normal affect and thought content      Anothony Bursch M. Jerline Pain, MD 11/11/2022 11:29 AM

## 2023-02-16 ENCOUNTER — Ambulatory Visit: Payer: BC Managed Care – PPO | Admitting: Family Medicine

## 2023-02-16 ENCOUNTER — Encounter: Payer: Self-pay | Admitting: Family Medicine

## 2023-02-16 VITALS — BP 123/80 | HR 92 | Temp 98.4°F | Ht 68.0 in | Wt 148.0 lb

## 2023-02-16 DIAGNOSIS — J309 Allergic rhinitis, unspecified: Secondary | ICD-10-CM | POA: Diagnosis not present

## 2023-02-16 DIAGNOSIS — K589 Irritable bowel syndrome without diarrhea: Secondary | ICD-10-CM

## 2023-02-16 DIAGNOSIS — L509 Urticaria, unspecified: Secondary | ICD-10-CM

## 2023-02-16 NOTE — Assessment & Plan Note (Signed)
Still has persistent symptoms despite being on Xyzal 10 mg daily.  This is likely the source of his recurrent erythematous rash.  He does have topical cream from the dermatologist to use as needed however he would like to prevent flareups from starting if possible.  We will place referral to allergist for further evaluation and management.

## 2023-02-16 NOTE — Patient Instructions (Signed)
It was very nice to see you today!  We will refer you to see the allergist.  Return if symptoms worsen or fail to improve.   Take care, Dr Jimmey Ralph  PLEASE NOTE:  If you had any lab tests, please let us know if you have not heard back within a few days. You may see your results on mychart before we have a chance to review them but we will give you a call once they are reviewed by [place].   If we ordered any referrals today, please let us know if you have not heard from their office within the next week.   If you had any urgent prescriptions sent in today, please check with the pharmacy within an hour of our visit to make sure the prescription was transmitted appropriately.   Please try these tips to maintain a healthy lifestyle:  Eat at least 3 REAL meals and 1-2 snacks per day.  Aim for no more than 5 hours between eating.  If you eat breakfast, please do so within one hour of getting up.   Each meal should contain half fruits/vegetables, one quarter protein, and one quarter carbs (no bigger than a computer mouse)  Cut down on sweet beverages. This includes juice, soda, and sweet tea.   Drink at least 1 glass of water with each meal and aim for at least 8 glasses per day  Exercise at least 150 minutes every week.

## 2023-02-16 NOTE — Progress Notes (Signed)
   Caleb Warner is a 37 y.o. male who presents today for an office visit.  Assessment/Plan:  Chronic Problems Addressed Today: Urticaria Still has persistent symptoms despite being on Xyzal 10 mg daily.  This is likely the source of his recurrent erythematous rash.  He does have topical cream from the dermatologist to use as needed however he would like to prevent flareups from starting if possible.  We will place referral to allergist for further evaluation and management.  Allergic rhinitis Stable on xyzal and Astelin.  IBS (irritable bowel syndrome) Doing much better today than last time we saw him a couple of months ago.  Has seen GI and had EGD.  Now on Protonix 40 mg daily and Bentyl 20 mg 4 times daily.  Will continue both these medications.    Subjective:  HPI:  Patient here for follow-up.  Main concern today is recurrent rash.  He does have a dermatologist and has been following with them.  Apparently had a biopsy done recently which was nonspecific and did not show any obvious etiology.  His dermatologist was worried that he may have been having some allergic reaction and advised him to follow-up.  Get allergy testing performed.  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  This seems to have mild success however still has persistent symptoms.  Also has dermatographia.  No obvious triggering events though does notice that he will sometimes get a large erythematous scaly rashes.  His dermatologist has prescribed cream which works well for this however he would like to determine if there is any underlying etiology.       Objective:  Physical Exam: BP 123/80   Pulse 92   Temp 98.4 F (36.9 C) (Temporal)   Ht 5\' 8"  (1.727 m)   Wt 148 lb (67.1 kg)   SpO2 99%   BMI 22.50 kg/m   Gen: No acute distress, resting comfortably Skin: Several scattered urticarial appearing lesions on upper extremities.  A few areas of  postinflammatory hyperpigmentation noted as well. Neuro: Grossly normal, moves all extremities Psych: Normal affect and thought content      Andres Bantz M. Jimmey Ralph, MD 02/16/2023 2:02 PM

## 2023-02-16 NOTE — Assessment & Plan Note (Signed)
Stable on xyzal and Astelin.

## 2023-02-16 NOTE — Assessment & Plan Note (Signed)
Doing much better today than last time we saw him a couple of months ago.  Has seen GI and had EGD.  Now on Protonix 40 mg daily and Bentyl 20 mg 4 times daily.  Will continue both these medications.

## 2023-02-18 IMAGING — US US ABDOMEN COMPLETE
1 series · 15 of 25 positions shown · non-contrast
Comparison: No pertinent prior exams available for comparison.

CLINICAL DATA: Provided history: Pain of upper abdomen.  Bloating.

EXAM:
ABDOMEN ULTRASOUND COMPLETE

[Series 1: us abdomen complete mc & wl · 15 of 125 slices shown]
[im 1/125]
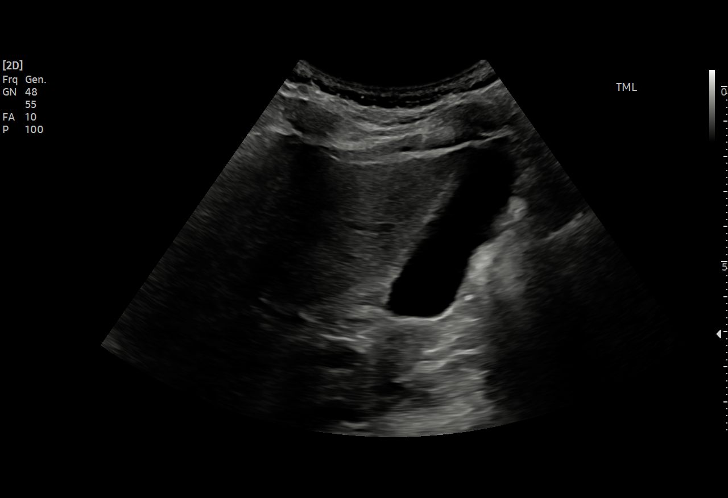
[im 11/125]
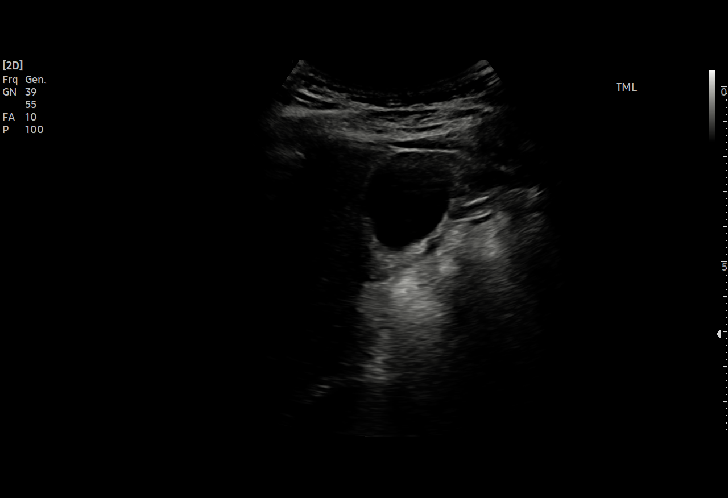
[im 21/125]
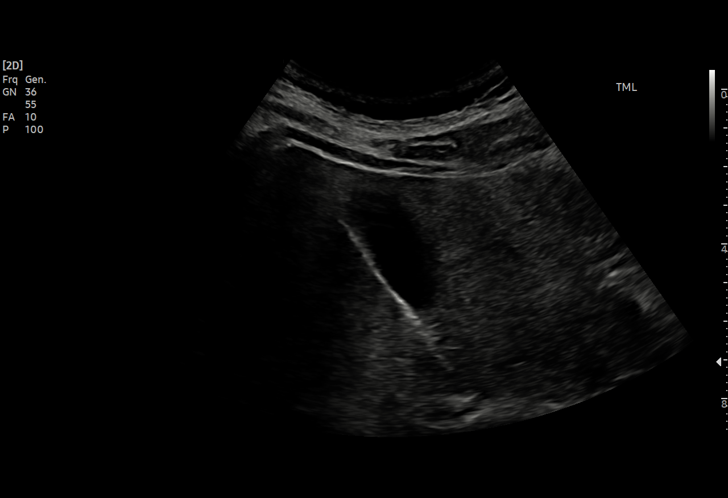
[im 26/125]
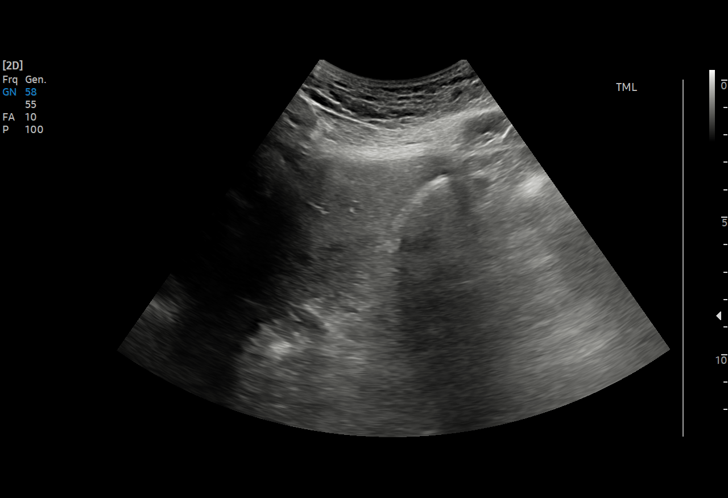
[im 37/125]
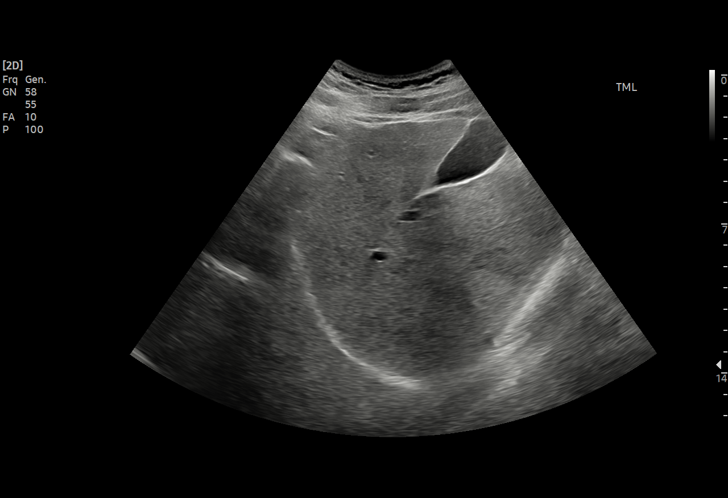
[im 47/125]
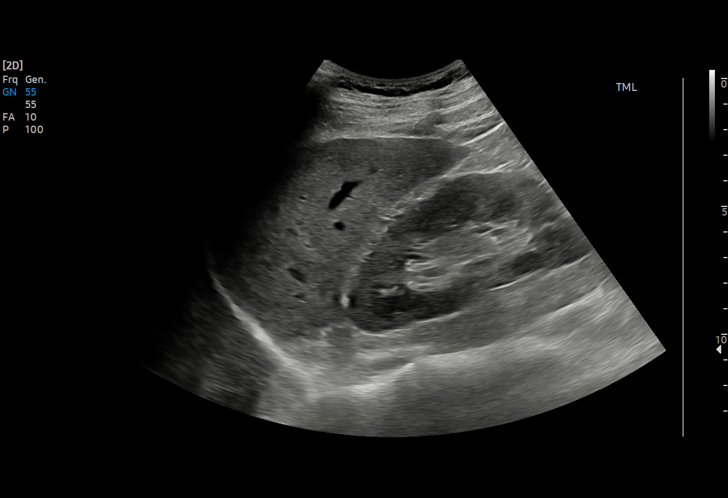
[im 52/125]
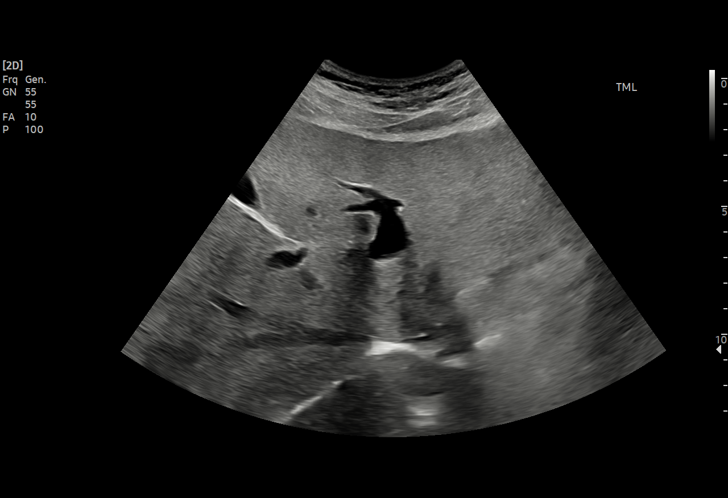
[im 63/125]
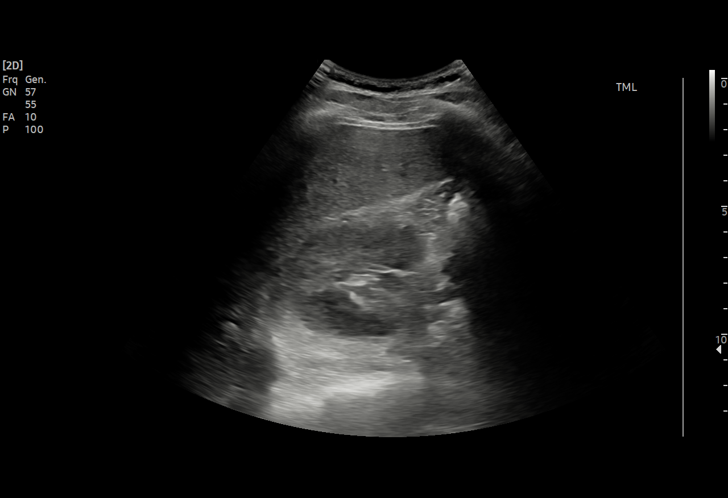
[im 73/125]
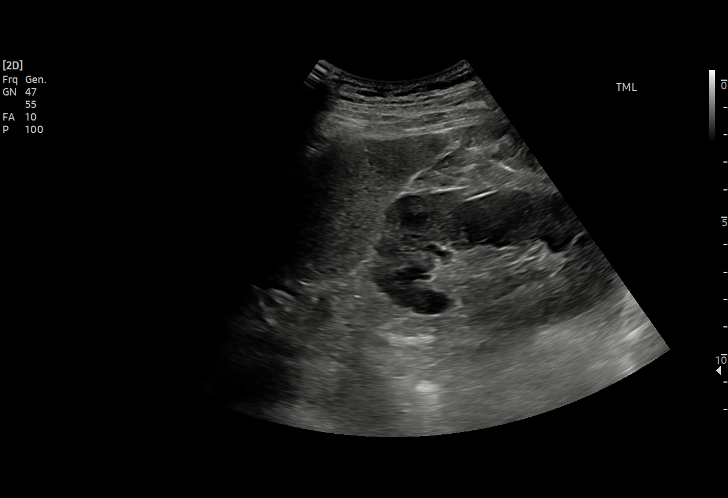
[im 78/125]
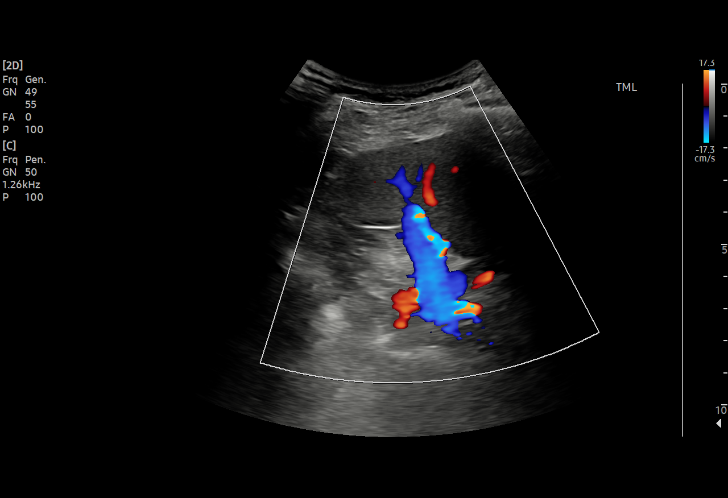
[im 88/125]
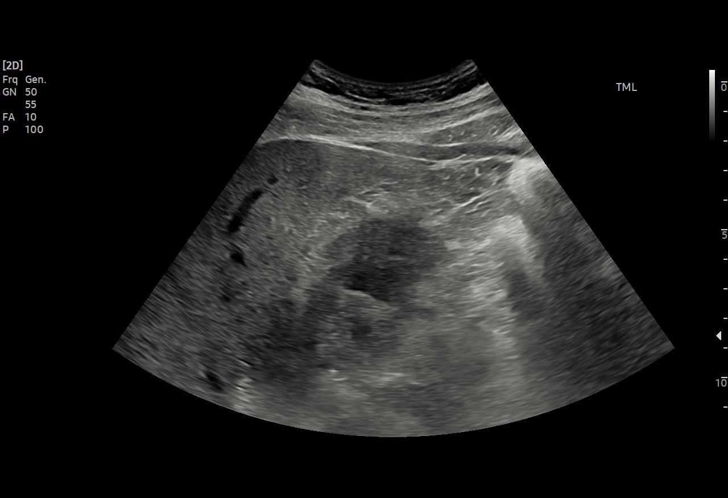
[im 99/125]
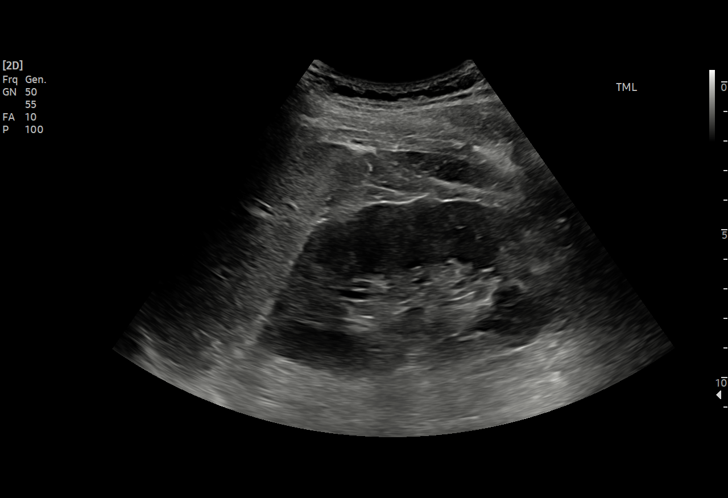
[im 104/125]
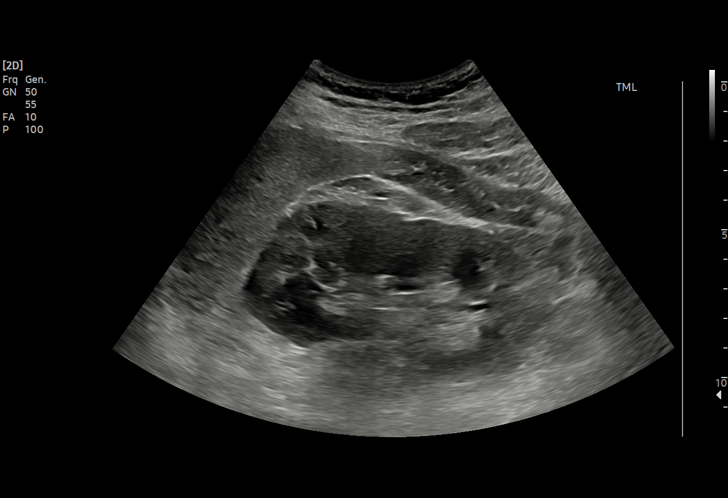
[im 114/125]
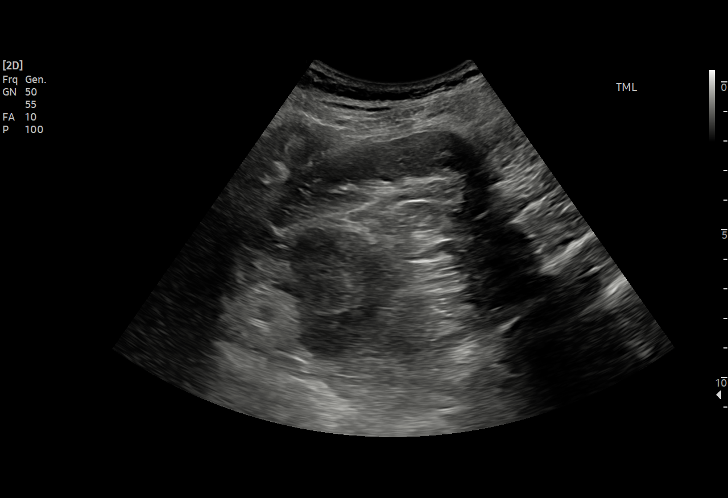
[im 125/125]
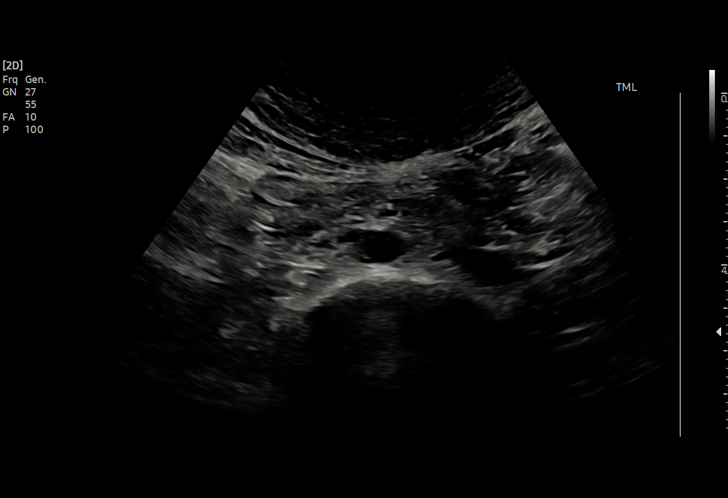

[15 of 25 positions shown; findings below may reference images not displayed]

FINDINGS: Gallbladder: No gallstones or wall thickening visualized. No
sonographic Murphy sign noted by sonographer.

Common bile duct: Diameter: 3-4 mm, within normal limits.

Liver: No focal lesion identified. Within normal limits in
parenchymal echogenicity. Portal vein is patent on color Doppler
imaging with normal direction of blood flow towards the liver.

IVC: No abnormality visualized.

Pancreas: Visualized portion unremarkable.

Spleen: Size and appearance within normal limits.

Right Kidney: Length: 10.2 cm. Echogenicity within normal limits. No
mass or hydronephrosis visualized.

Left Kidney: Length: 10.8 cm. Echogenicity within normal limits. No
mass or hydronephrosis visualized.

Abdominal aorta: No aneurysm visualized.
IMPRESSION: Unremarkable abdominal ultrasound, as described.

## 2023-03-26 IMAGING — DX DG CHEST 2V
2 series · 2 of 2 positions shown · non-contrast
Comparison: September 12, 2019

CLINICAL DATA: History of positive TB test.

EXAM:
CHEST - 2 VIEW

[chest pa]
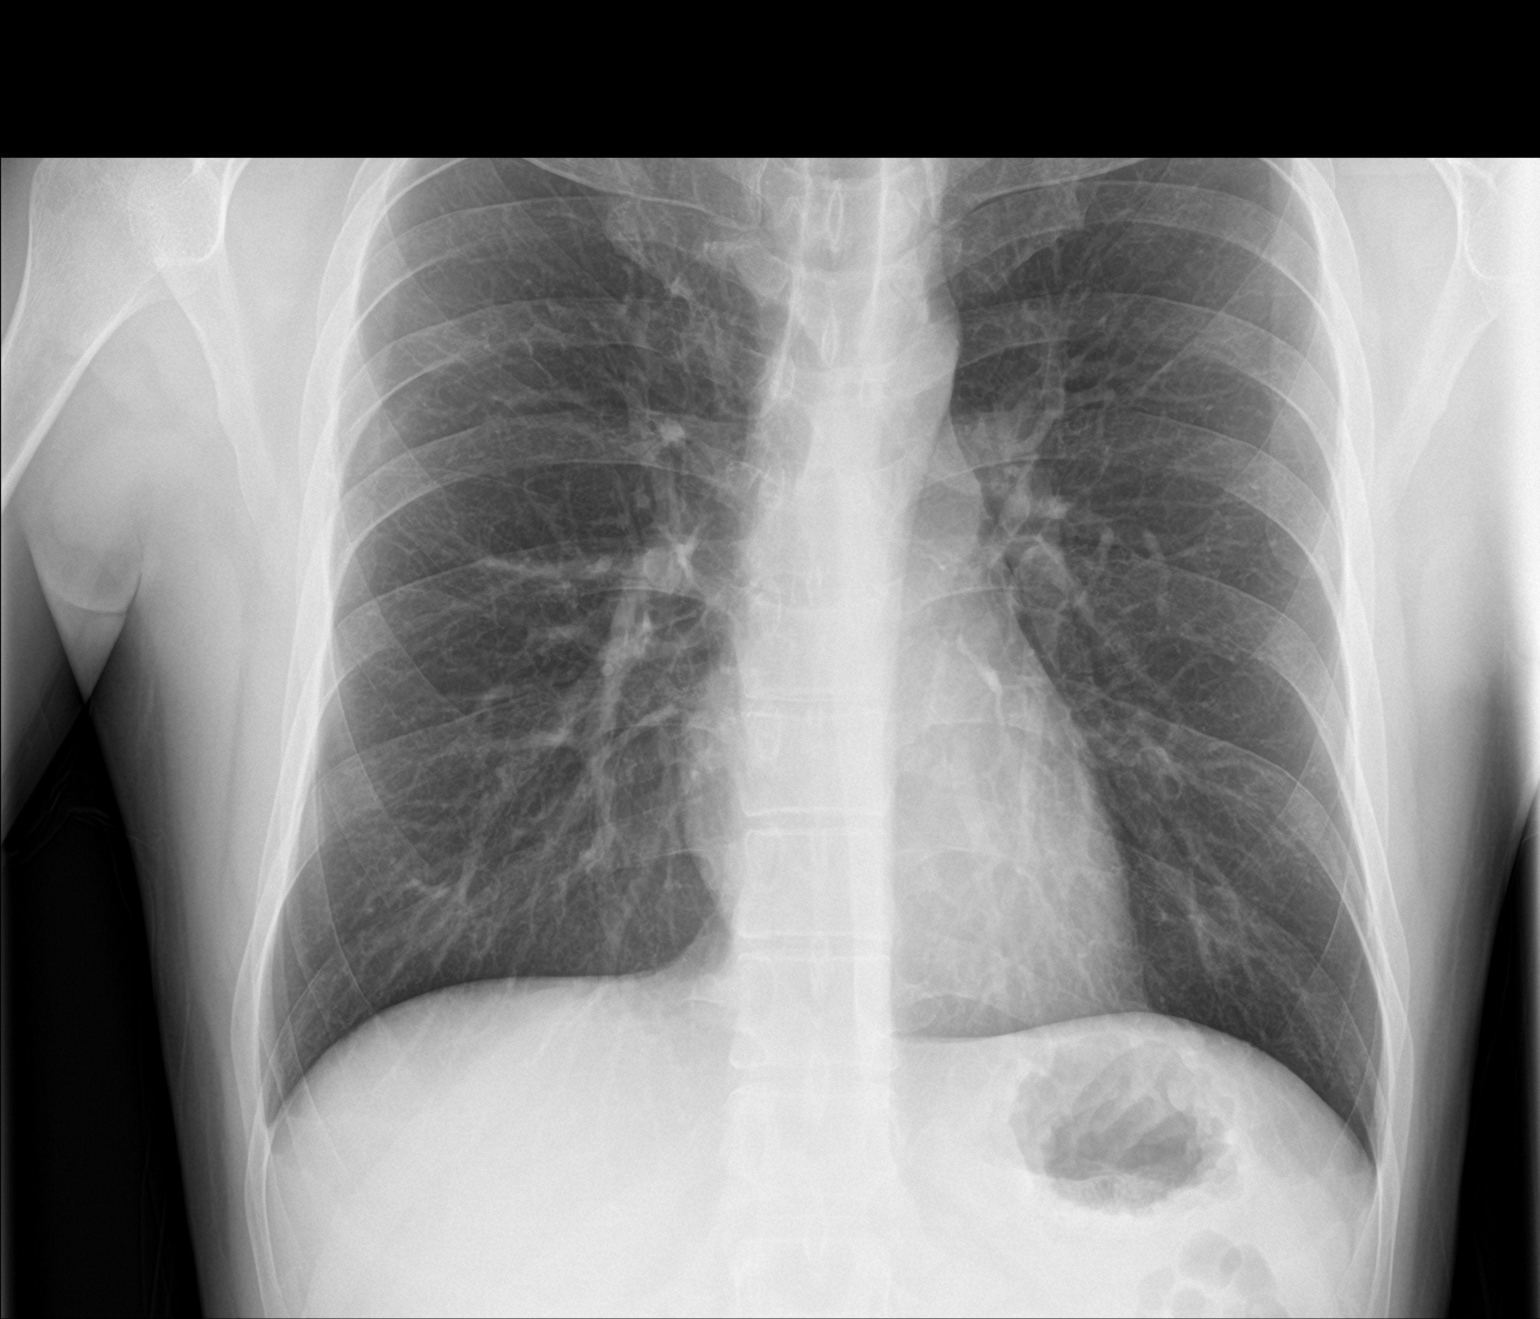

[chest lat]
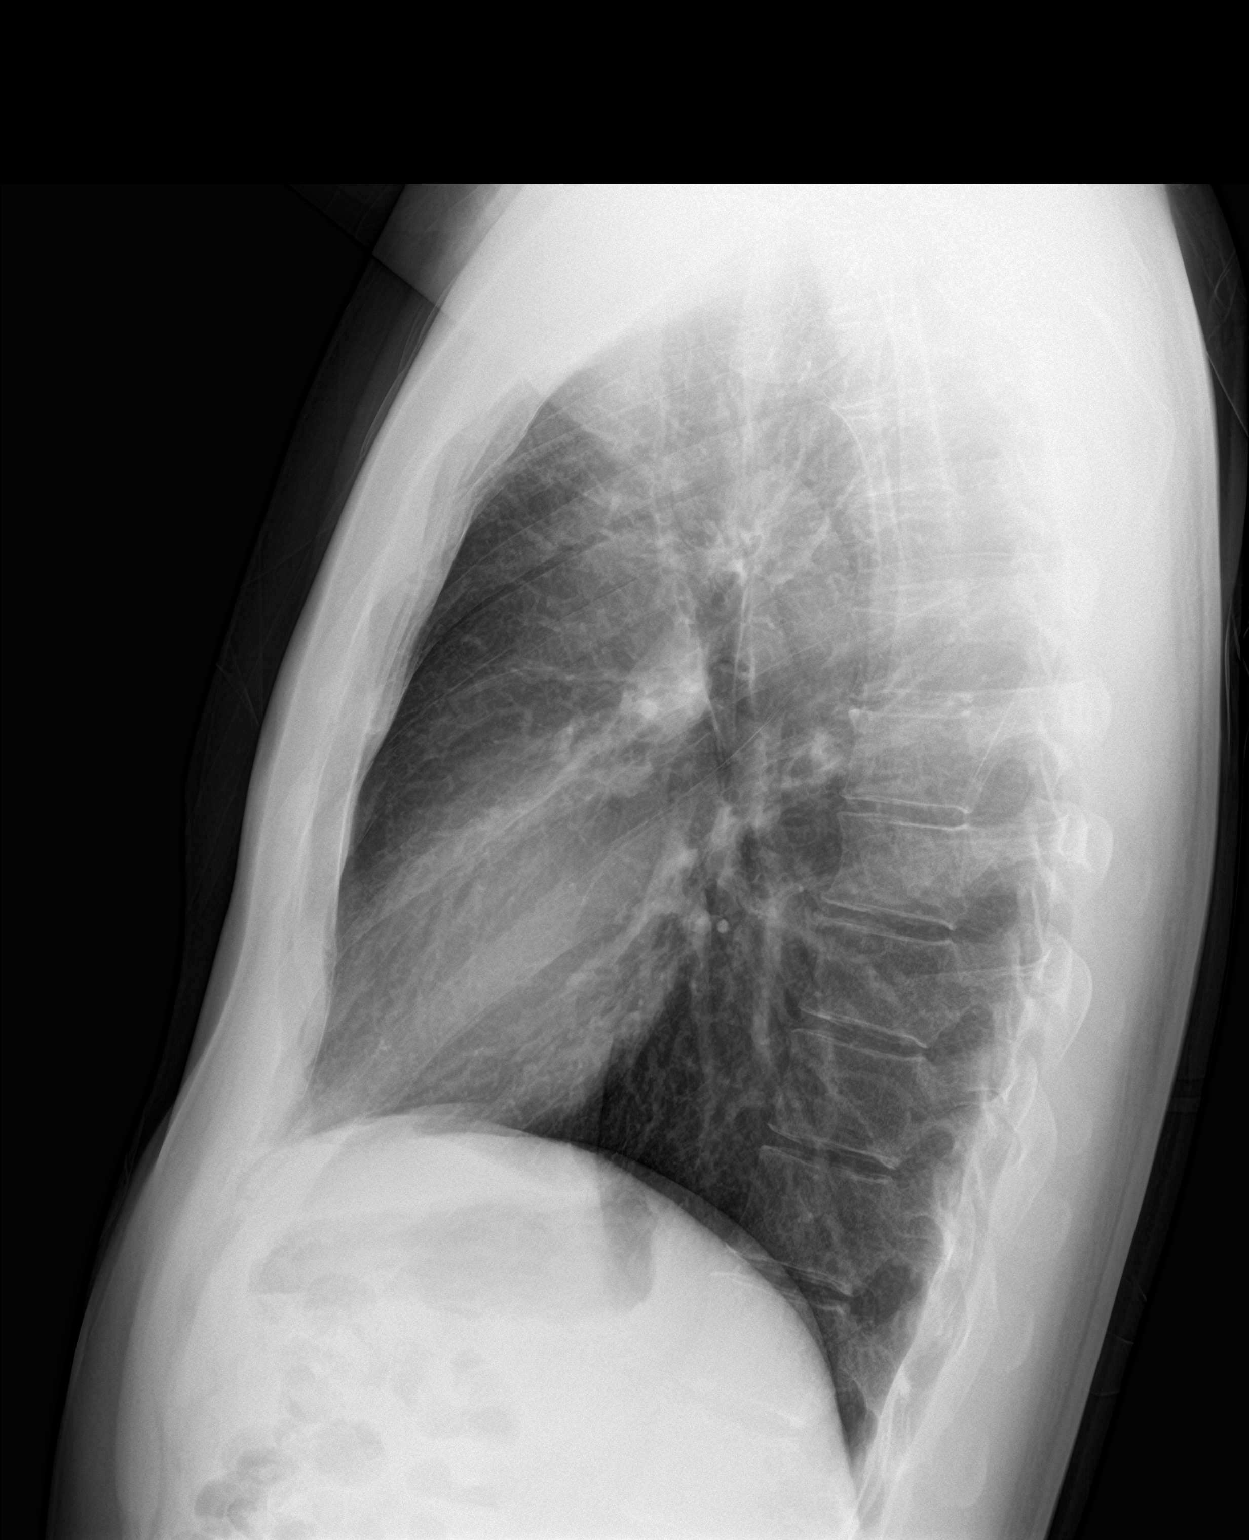

[2 of 2 positions shown; findings below may reference images not displayed]

FINDINGS: The heart size and mediastinal contours are within normal limits.
Both lungs are clear. The visualized skeletal structures are
unremarkable.
IMPRESSION: No active cardiopulmonary disease.

## 2023-04-03 NOTE — Progress Notes (Unsigned)
New Patient Note  RE: Caleb Warner MRN: [id number] DOB: 1986-01-26 Date of Office Visit: 04/04/2023  Consult requested by: Ardith Dark, MD Primary care provider: Ardith Dark, MD  Chief Complaint: Pruritus (Itchy skin- dermatographic.), Allergic Rhinitis  (Itchy, watery eyes ), and Urticaria (At times will breakout in hives- when sweaty )  History of Present Illness: I had the pleasure of seeing Caleb Warner for initial evaluation at the [facility] on 04/05/2023. He is a 37 y.o. male, who is referred here by Ardith Dark, MD for the evaluation of rash.  Rash started about 6-7 years ago when he moved from [place] to [place]. This can occur anywhere on the body. Describes them as itchy, red, raised. He also has dermatographism. Individual rashes lasts about 1 hour. No ecchymosis upon resolution. Associated symptoms include: none.  Frequency of episodes: daily. Suspected triggers are unknown but sweating makes it worse. Denies any fevers, chills, changes in medications, foods, personal care products or recent infections. He has tried the following therapies: Xyzal 2 tablets with good benefit. Systemic steroids: no. Previous work up includes: patient follows with dermatology for acne. Previous history of rash/hives: no. Patient is up to date with the following cancer screening tests: physical exam, annual bloodwork.  02/16/2023 PCP visit: "Patient here for follow-up.  Main concern today is recurrent rash.  He does have a dermatologist and has been following with them.  Apparently had a biopsy done recently which was nonspecific and did not show any obvious etiology.  His dermatologist was worried that he may have been having some allergic reaction and advised him to follow-up.  Get allergy testing performed.  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  This seems to have mild success however still has persistent  symptoms.  Also has dermatographia.  No obvious triggering events though does notice that he will sometimes get a large erythematous scaly rashes.  His dermatologist has prescribed cream which works well for this however he would like to determine if there is any underlying etiology. "  Assessment and Plan: Caleb Warner is a 37 y.o. male with: Urticaria Pruritic rash since moved to [place] 6+ years ago. Saw dermatology and diagnosed with dermatographism but concerned about allergic triggers. Daily Xyzal helps but still symptomatic.  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 Will obtain bloodwork instead.  Discussed with patient, that urticaria is usually caused by release of histamine by cutaneous mast cells but sometimes it is non-histamine mediated. Explained that urticaria is not always associated with allergies. In most cases, the exact etiology for urticaria can not be established and it is considered idiopathic. Try to take Xyzal (5mg ) 1 tablet at night and take allegra (fexofenadine) 180mg  1 tablet in the morning.  If symptoms are not controlled or causes drowsiness let us know. If symptoms are not controlled then:  Start Pepcid (famotidine) 20mg  twice a day.  Avoid the following potential triggers: alcohol, tight clothing, NSAIDs, hot showers and getting overheated. See below for proper skin care.  Get bloodwork.  Other allergic rhinitis Perennial rhinitis symptoms. No prior testing. 2 cats at home. Get bloodwork for environmental panel as patient has significant dermatographism which will make the skin testing all false positive. Will make additional recommendations based on results. The antihistamines as above should also help with  these symptoms.   Return in about 2 months (around 06/05/2023).  No orders of the defined types were placed in this encounter.  Lab Orders         Allergens w/Total IgE Area 2          Alpha-Gal Panel         ANA w/Reflex         C3 and C4         CBC with Differential/Platelet         Chronic Urticaria         Comprehensive metabolic panel         C-reactive protein         Tryptase         Thyroid Cascade Profile         Sedimentation rate      Other allergy screening: Asthma: no Rhino conjunctivitis:  some nasal congestion, sneezing all year around. No prior testing.   Food allergy: no Medication allergy: yes Hymenoptera allergy: no History of recurrent infections suggestive of immunodeficency: no  Diagnostics: None.   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 180 tablet 3   No current facility-administered medications for this visit.   Allergies: Allergies  Allergen Reactions   Accutane [Isotretinoin] Swelling   Butternut [Butternut (Juglans Cinerea)]     "Butternut squash causes abdominal pain.    Other    Social History: Social History   Socioeconomic History   Marital status: Married    Spouse name: Not on file   Number of children: Not on file   Years of education: Not on file   Highest education level: Not on file  Occupational History   Not on file  Tobacco Use   Smoking status: Never   Smokeless tobacco: Never  Vaping Use   Vaping Use: Never used  Substance and  Sexual Activity   Alcohol use: Not Currently   Drug use: No   Sexual activity: Not on file  Other Topics Concern   Not on file  Social History Narrative   Not on file   Social Determinants of Health   Financial Resource Strain: Not on file  Food Insecurity: Not on file  Transportation Needs: Not on file  Physical Activity: Not on file  Stress: Not on file  Social Connections: Not on file   Lives in a house which is 35 years old. Smoking: denies Occupation: Adult nurse HistorySurveyor, minerals in the house: no Engineer, civil (consulting) in the family room: yes Carpet in the bedroom: yes Heating: electric Cooling: central Pet: yes 2 cats  Family History: Family History  Problem Relation Age of Onset   Eczema Mother    Psoriasis Mother    Diabetes Maternal Grandmother  Prostate cancer Neg Hx    Problem                               Relation Asthma                                   no Eczema                                no Food allergy                          no Allergic rhino conjunctivitis     no  Review of Systems  Constitutional:  Negative for appetite change, chills, fever and unexpected weight change.  HENT:  Negative for congestion and rhinorrhea.   Eyes:  Negative for itching.  Respiratory:  Negative for cough, chest tightness, shortness of breath and wheezing.   Cardiovascular:  Negative for chest pain.  Gastrointestinal:  Negative for abdominal pain.  Genitourinary:  Negative for difficulty urinating.  Skin:  Positive for rash.  Neurological:  Negative for headaches.   Objective: BP 108/68   Pulse 85   Temp 98.3 F (36.8 C)   Resp 16   Ht 5' 9.29" (1.76 m)   Wt 152 lb 12 oz (69.3 kg)   SpO2 97%   BMI 22.37 kg/m  Body mass index is 22.37 kg/m. Physical Exam Vitals and nursing note reviewed.  Constitutional:      Appearance: Normal appearance. He is well-developed.  HENT:     Head: Normocephalic and atraumatic.     Right Ear: Tympanic  membrane and external ear normal.     Left Ear: Tympanic membrane and external ear normal.     Nose: Nose normal.     Mouth/Throat:     Mouth: Mucous membranes are moist.     Pharynx: Oropharynx is clear.  Eyes:     Conjunctiva/sclera: Conjunctivae normal.  Cardiovascular:     Rate and Rhythm: Normal rate and regular rhythm.     Heart sounds: Normal heart sounds. No murmur heard.    No friction rub. No gallop.  Pulmonary:     Effort: Pulmonary effort is normal.     Breath sounds: Normal breath sounds. No wheezing, rhonchi or rales.  Musculoskeletal:     Cervical back: Neck supple.  Skin:    General: Skin is warm.     Findings: Rash present.     Comments: +2 dermatographism.  Neurological:     Mental Status: He is alert and oriented to person, place, and time.  Psychiatric:        Behavior: Behavior normal.   The plan was reviewed with the patient/family, and all questions/concerned were addressed.  It was my pleasure to see Caleb Warner today and participate in his care. Please feel free to contact me with any questions or concerns.  Sincerely,  Wyline Mood, DO Allergy & Immunology  [facility] office: [phone] [facility] office: [phone]

## 2023-04-04 ENCOUNTER — Encounter: Payer: Self-pay | Admitting: Allergy

## 2023-04-04 ENCOUNTER — Ambulatory Visit: Payer: BC Managed Care – PPO | Admitting: Allergy

## 2023-04-04 ENCOUNTER — Other Ambulatory Visit: Payer: Self-pay

## 2023-04-04 VITALS — BP 108/68 | HR 85 | Temp 98.3°F | Resp 16 | Ht 69.29 in | Wt 152.8 lb

## 2023-04-04 DIAGNOSIS — L503 Dermatographic urticaria: Secondary | ICD-10-CM | POA: Diagnosis not present

## 2023-04-04 DIAGNOSIS — L299 Pruritus, unspecified: Secondary | ICD-10-CM | POA: Diagnosis not present

## 2023-04-04 DIAGNOSIS — L509 Urticaria, unspecified: Secondary | ICD-10-CM | POA: Diagnosis not present

## 2023-04-04 DIAGNOSIS — J3089 Other allergic rhinitis: Secondary | ICD-10-CM | POA: Diagnosis not present

## 2023-04-04 NOTE — Patient Instructions (Addendum)
Skin: Discussed with patient, that urticaria is usually caused by release of histamine by cutaneous mast cells but sometimes it is non-histamine mediated. Explained that urticaria is not always associated with allergies. In most cases, the exact etiology for urticaria can not be established and it is considered idiopathic.  Try to take Xyzal (5mg ) 1 tablet at night and take allegra (fexofenadine) 180mg  1 tablet in the morning.  If symptoms are not controlled or causes drowsiness let us know. If symptoms are not controlled then:  Start pepcid (famotidine) 20mg  twice a day.  Avoid the following potential triggers: alcohol, tight clothing, NSAIDs, hot showers and getting overheated. See below for proper skin care.   Get bloodwork:  We are ordering labs, so please allow 1-2 weeks for the results to come back. With the newly implemented Cures Act, the labs might be visible to you at the same time that they become visible to me. However, I will not address the results until all of the results are back, so please be patient.   Follow up in 2 months or sooner if needed.  Skin care recommendations  Bath time: Always use lukewarm water. AVOID very hot or cold water. Keep bathing time to 5-10 minutes. Do NOT use bubble bath. Use a mild soap and use just enough to wash the dirty areas. Do NOT scrub skin vigorously.  After bathing, pat dry your skin with a towel. Do NOT rub or scrub the skin.  Moisturizers and prescriptions:  ALWAYS apply moisturizers immediately after bathing (within 3 minutes). This helps to lock-in moisture. Use the moisturizer several times a day over the whole body. Good summer moisturizers include: Aveeno, CeraVe, Cetaphil. Good winter moisturizers include: Aquaphor, Vaseline, Cerave, Cetaphil, Eucerin, Vanicream. When using moisturizers along with medications, the moisturizer should be applied about one hour after applying the medication to prevent diluting effect of the  medication or moisturize around where you applied the medications. When not using medications, the moisturizer can be continued twice daily as maintenance.  Laundry and clothing: Avoid laundry products with added color or perfumes. Use unscented hypo-allergenic laundry products such as Tide free, Cheer free & gentle, and All free and clear.  If the skin still seems dry or sensitive, you can try double-rinsing the clothes. Avoid tight or scratchy clothing such as wool. Do not use fabric softeners or dyer sheets.

## 2023-04-05 ENCOUNTER — Encounter: Payer: Self-pay | Admitting: Allergy

## 2023-04-05 NOTE — Assessment & Plan Note (Signed)
Pruritic rash since moved to [place] 6+ years ago. Saw dermatology and diagnosed with dermatographism but concerned about allergic triggers. Daily Xyzal helps but still symptomatic.  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 Will obtain bloodwork instead.  Discussed with patient, that urticaria is usually caused by release of histamine by cutaneous mast cells but sometimes it is non-histamine mediated. Explained that urticaria is not always associated with allergies. In most cases, the exact etiology for urticaria can not be established and it is considered idiopathic. Try to take Xyzal (5mg ) 1 tablet at night and take allegra (fexofenadine) 180mg  1 tablet in the morning.  If symptoms are not controlled or causes drowsiness let us know. If symptoms are not controlled then:  Start Pepcid (famotidine) 20mg  twice a day.  Avoid the following potential triggers: alcohol, tight clothing, NSAIDs, hot showers and getting overheated. See below for proper skin care.  Get bloodwork.

## 2023-04-05 NOTE — Assessment & Plan Note (Signed)
Perennial rhinitis symptoms. No prior testing. 2 cats at home. Get bloodwork for environmental panel as patient has significant dermatographism which will make the skin testing all false positive. Will make additional recommendations based on results. The antihistamines as above should also help with these symptoms.

## 2023-04-18 ENCOUNTER — Ambulatory Visit: Payer: BC Managed Care – PPO | Admitting: Allergy & Immunology

## 2023-04-19 LAB — ALLERGENS W/TOTAL IGE AREA 2
Alternaria Alternata IgE: <0.1 kU/L
Aspergillus Fumigatus IgE: <0.1 kU/L
Bermuda Grass IgE: <0.1 kU/L
Cat Dander IgE: <0.1 kU/L
Cedar, Mountain IgE: <0.1 kU/L
Cladosporium Herbarum IgE: <0.1 kU/L
Cockroach, German IgE: 0.14 kU/L — AB
Common Silver Birch IgE: <0.1 kU/L
Cottonwood IgE: <0.1 kU/L
D Farinae IgE: 0.11 kU/L — AB
D Pteronyssinus IgE: 0.12 kU/L — AB
Dog Dander IgE: <0.1 kU/L
Elm, American IgE: <0.1 kU/L
Johnson Grass IgE: <0.1 kU/L
Maple/Box Elder IgE: <0.1 kU/L
Mouse Urine IgE: <0.1 kU/L
Oak, White IgE: <0.1 kU/L
Pecan, Hickory IgE: <0.1 kU/L
Penicillium Chrysogen IgE: <0.1 kU/L
Pigweed, Rough IgE: <0.1 kU/L
Ragweed, Short IgE: <0.1 kU/L
Sheep Sorrel IgE Qn: <0.1 kU/L
Timothy Grass IgE: <0.1 kU/L
White Mulberry IgE: <0.1 kU/L

## 2023-04-19 LAB — CBC WITH DIFFERENTIAL/PLATELET
Basophils Absolute: 0.1 10*3/uL (ref 0.0–0.2)
Basos: 1 %
EOS (ABSOLUTE): 0.2 10*3/uL (ref 0.0–0.4)
Eos: 2 %
Hematocrit: 47.9 % (ref 37.5–51.0)
Hemoglobin: 16 g/dL (ref 13.0–17.7)
Immature Grans (Abs): 0 10*3/uL (ref 0.0–0.1)
Immature Granulocytes: 0 %
Lymphocytes Absolute: 2.3 10*3/uL (ref 0.7–3.1)
Lymphs: 35 %
MCH: 29.3 pg (ref 26.6–33.0)
MCHC: 33.4 g/dL (ref 31.5–35.7)
MCV: 88 fL (ref 79–97)
Monocytes Absolute: 0.4 10*3/uL (ref 0.1–0.9)
Monocytes: 6 %
Neutrophils Absolute: 3.5 10*3/uL (ref 1.4–7.0)
Neutrophils: 56 %
Platelets: 289 10*3/uL (ref 150–450)
RBC: 5.46 x10E6/uL (ref 4.14–5.80)
RDW: 12.6 % (ref 11.6–15.4)
WBC: 6.4 10*3/uL (ref 3.4–10.8)

## 2023-04-19 LAB — THYROID CASCADE PROFILE: TSH: 1.02 u[IU]/mL (ref 0.450–4.500)

## 2023-04-19 LAB — COMPREHENSIVE METABOLIC PANEL
ALT: 43 IU/L (ref 0–44)
AST: 29 IU/L (ref 0–40)
Albumin: 4.7 g/dL (ref 4.1–5.1)
Alkaline Phosphatase: 88 IU/L (ref 44–121)
BUN/Creatinine Ratio: 13 (ref 9–20)
BUN: 10 mg/dL (ref 6–20)
Bilirubin Total: 0.4 mg/dL (ref 0.0–1.2)
CO2: 21 mmol/L (ref 20–29)
Calcium: 9.7 mg/dL (ref 8.7–10.2)
Chloride: 103 mmol/L (ref 96–106)
Creatinine, Ser: 0.76 mg/dL (ref 0.76–1.27)
Globulin, Total: 2.3 g/dL (ref 1.5–4.5)
Glucose: 97 mg/dL (ref 70–99)
Potassium: 4.4 mmol/L (ref 3.5–5.2)
Sodium: 140 mmol/L (ref 134–144)
Total Protein: 7 g/dL (ref 6.0–8.5)
eGFR: 120 mL/min/{1.73_m2} (ref >59)

## 2023-04-19 LAB — ALPHA-GAL PANEL
Allergen Lamb IgE: <0.1 kU/L
Beef IgE: <0.1 kU/L
IgE (Immunoglobulin E), Serum: 130 IU/mL (ref 6–495)
O215-IgE Alpha-Gal: <0.1 kU/L
Pork IgE: <0.1 kU/L

## 2023-04-19 LAB — C3 AND C4
Complement C3, Serum: 143 mg/dL (ref 82–167)
Complement C4, Serum: 25 mg/dL (ref 12–38)

## 2023-04-19 LAB — ANA W/REFLEX: Anti Nuclear Antibody (ANA): NEGATIVE

## 2023-04-19 LAB — SEDIMENTATION RATE: Sed Rate: 10 mm/h (ref 0–15)

## 2023-04-19 LAB — C-REACTIVE PROTEIN: CRP: <1 mg/L (ref 0–10)

## 2023-04-19 LAB — CHRONIC URTICARIA: cu index: 3.4 (ref <10)

## 2023-04-19 LAB — TRYPTASE: Tryptase: 3.8 ug/L (ref 2.2–13.2)

## 2023-06-08 ENCOUNTER — Ambulatory Visit: Payer: BC Managed Care – PPO | Admitting: Allergy

## 2023-06-08 DIAGNOSIS — J309 Allergic rhinitis, unspecified: Secondary | ICD-10-CM

## 2023-06-08 NOTE — Progress Notes (Deleted)
Follow Up Note  RE: Caleb Warner MRN: [id number] DOB: 1985/12/24 Date of Office Visit: 06/08/2023  Referring provider: Ardith Dark, MD Primary care provider: Ardith Dark, MD  Chief Complaint: No chief complaint on file.  History of Present Illness: I had the pleasure of seeing Caleb Warner for a follow up visit at the [facility] on 06/08/2023. He is a 37 y.o. male, who is being followed for chronic urticaria and allergic rhinitis. His previous allergy office visit was on 04/04/2023 with Dr. Selena Batten. Today is a regular follow up visit.   I reviewed the bloodwork. Blood count, kidney function, liver function, electrolytes, thyroid, autoimmune screener, inflammation markers, chronic urticaria index (checks for autoantibodies that trigger mast cells), tryptase (checks for mast cell issues) and alpha gal (checks for red meat allergy) were all normal which is great.   Environmental panel was essentially negative.   Based on these results no triggers for your rash.   Recommend to take Xyzal at night and allegra in the morning.  Urticaria Pruritic rash since moved to  6+ years ago. Saw dermatology and diagnosed with dermatographism but concerned about allergic triggers. Daily Xyzal helps but still symptomatic.  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 Will obtain bloodwork instead.  Discussed with patient, that urticaria is usually caused by release of histamine by cutaneous mast cells but sometimes it is non-histamine mediated. Explained that urticaria is not always associated with allergies. In most cases, the exact etiology for urticaria can not be established and it is considered idiopathic. Try to take Xyzal (5mg ) 1 tablet at night and take allegra (fexofenadine) 180mg  1 tablet in the morning.  If symptoms are not controlled or causes drowsiness let us know. If symptoms  are not controlled then:  Start Pepcid (famotidine) 20mg  twice a day.  Avoid the following potential triggers: alcohol, tight clothing, NSAIDs, hot showers and getting overheated. See below for proper skin care.  Get bloodwork.   Other allergic rhinitis Perennial rhinitis symptoms. No prior testing. 2 cats at home. Get bloodwork for environmental panel as patient has significant dermatographism which will make the skin testing all false positive. Will make additional recommendations based on results. The antihistamines as above should also help with these symptoms.   Assessment and Plan: Caleb Warner is a 37 y.o. male with: Chronic idiopathic urticaria ***  Dermatographic urticaria ***  Other allergic rhinitis ***   No follow-ups on file.  No orders of the defined types were placed in this encounter.  Lab Orders  No laboratory test(s) ordered today    Diagnostics: Spirometry:  Tracings reviewed. His effort: {Blank single:19197::"Good reproducible efforts.","It was hard to get consistent efforts and there is a question as to whether this reflects a maximal maneuver.","Poor effort, data can not be interpreted."} FVC: ***L FEV1: ***L, ***% predicted FEV1/FVC ratio: ***% Interpretation: {Blank single:19197::"Spirometry consistent with mild obstructive disease","Spirometry consistent with moderate obstructive disease","Spirometry consistent with severe obstructive disease","Spirometry consistent with possible restrictive disease","Spirometry consistent with mixed obstructive and restrictive disease","Spirometry uninterpretable due to technique","Spirometry consistent with normal pattern","No overt abnormalities noted given today's efforts"}.  Please see scanned spirometry results for details.  Skin Testing: {Blank single:19197::"Select foods","Environmental allergy panel","Environmental allergy panel and select foods","Food allergy panel","None","Deferred due to recent antihistamines  use"}. *** Results discussed with patient/family.   Medication List:  Current Outpatient Medications  Medication Sig Dispense Refill  levocetirizine (XYZAL) 5 MG tablet Take 2 tablets (10 mg total) by mouth at bedtime. 180 tablet 3   No current facility-administered medications for this visit.   Allergies: Allergies  Allergen Reactions   Accutane [Isotretinoin] Swelling   Butternut [Butternut (Juglans Cinerea)]     "Butternut squash causes abdominal pain.    Other    I reviewed his past medical history, social history, family history, and environmental history and no significant changes have been reported from his previous visit.  Review of Systems  Constitutional:  Negative for appetite change, chills, fever and unexpected weight change.  HENT:  Negative for congestion and rhinorrhea.   Eyes:  Negative for itching.  Respiratory:  Negative for cough, chest tightness, shortness of breath and wheezing.   Cardiovascular:  Negative for chest pain.  Gastrointestinal:  Negative for abdominal pain.  Genitourinary:  Negative for difficulty urinating.  Skin:  Positive for rash.  Neurological:  Negative for headaches.    Objective: There were no vitals taken for this visit. There is no height or weight on file to calculate BMI. Physical Exam Vitals and nursing note reviewed.  Constitutional:      Appearance: Normal appearance. He is well-developed.  HENT:     Head: Normocephalic and atraumatic.     Right Ear: Tympanic membrane and external ear normal.     Left Ear: Tympanic membrane and external ear normal.     Nose: Nose normal.     Mouth/Throat:     Mouth: Mucous membranes are moist.     Pharynx: Oropharynx is clear.  Eyes:     Conjunctiva/sclera: Conjunctivae normal.  Cardiovascular:     Rate and Rhythm: Normal rate and regular rhythm.     Heart sounds: Normal heart sounds. No murmur heard.    No friction rub. No gallop.  Pulmonary:     Effort: Pulmonary effort is  normal.     Breath sounds: Normal breath sounds. No wheezing, rhonchi or rales.  Musculoskeletal:     Cervical back: Neck supple.  Skin:    General: Skin is warm.     Findings: Rash present.     Comments: +2 dermatographism.  Neurological:     Mental Status: He is alert and oriented to person, place, and time.  Psychiatric:        Behavior: Behavior normal.    Previous notes and tests were reviewed. The plan was reviewed with the patient/family, and all questions/concerned were addressed.  It was my pleasure to see Caleb Warner today and participate in his care. Please feel free to contact me with any questions or concerns.  Sincerely,  Wyline Mood, DO Allergy & Immunology  [facility] office: [phone] [facility] office: [phone]

## 2023-07-13 ENCOUNTER — Ambulatory Visit: Payer: BC Managed Care – PPO | Admitting: Family Medicine

## 2023-07-13 ENCOUNTER — Encounter: Payer: Self-pay | Admitting: Family Medicine

## 2023-07-13 VITALS — BP 123/75 | HR 91 | Temp 98.0°F | Ht 69.29 in | Wt 153.4 lb

## 2023-07-13 DIAGNOSIS — F439 Reaction to severe stress, unspecified: Secondary | ICD-10-CM

## 2023-07-13 DIAGNOSIS — Z23 Encounter for immunization: Secondary | ICD-10-CM

## 2023-07-13 DIAGNOSIS — K589 Irritable bowel syndrome without diarrhea: Secondary | ICD-10-CM

## 2023-07-13 DIAGNOSIS — Z8719 Personal history of other diseases of the digestive system: Secondary | ICD-10-CM | POA: Diagnosis not present

## 2023-07-13 MED ORDER — PANTOPRAZOLE SODIUM 40 MG PO TBEC: 40.0000 mg | DELAYED_RELEASE_TABLET | Freq: Every day | ORAL | 3 refills | Status: AC

## 2023-07-13 MED ORDER — DICYCLOMINE HCL 20 MG PO TABS: 20.0000 mg | ORAL_TABLET | Freq: Four times a day (QID) | ORAL | 3 refills | Status: AC

## 2023-07-13 MED ORDER — AMITRIPTYLINE HCL 25 MG PO TABS: 25.0000 mg | ORAL_TABLET | Freq: Every day | ORAL | 0 refills | Status: DC

## 2023-07-13 NOTE — Assessment & Plan Note (Signed)
Symptoms have flared up significantly the last few weeks likely due to stress at work.  We are managing his stress as below.  We will restart his Protonix 40 mg daily and Bentyl 20 mg 4 times daily for his IBS.  He has done well with both of these previously.  He will follow-up with me in a week or 2.  If still having ongoing issues we will need to refer him back to GI.

## 2023-07-13 NOTE — Progress Notes (Signed)
   Caleb Warner is a 37 y.o. male who presents today for an office visit.  Assessment/Plan:  Chronic Problems Addressed Today: IBS (irritable bowel syndrome) Symptoms have flared up significantly the last few weeks likely due to stress at work.  We are managing his stress as below.  We will restart his Protonix 40 mg daily and Bentyl 20 mg 4 times daily for his IBS.  He has done well with both of these previously.  He will follow-up with me in a week or 2.  If still having ongoing issues we will need to refer him back to GI.  History of GI bleed Restart protonix 40 mg daily.  Has a history of peptic ulcer disease.  We discussed warning signs for recurrent GI bleed and reasons to return to care.  Stress He is having more stress due to work.  He is currently switching jobs which should help alleviate his stress however still has a few more weeks to go before changing.  We discussed management options to help with management for his stress.  He is agreeable to see a therapist however does not think that this is a good time as he is transitioning out of his current job and has not had time to do this.  We also discussed medication options.  Given his concurrent IBS he would be a good candidate for TCA.  We will start amitriptyline 25 mg nightly.  We discussed potential side effects.  Hopefully this will help both with his anxiety/stress/depression as well as his IBS symptoms.  He will follow-up with me in a week or so via MyChart and we can titrate dose as needed.  Preventative health care-flu shot given today.    Subjective:  HPI:  He has been under more stress recently due to work. This has caused his IBS to flare up. He does have a history of PUD and GI bleed and is concerned that he has had a recurrence. He has been under more stress for several weeks and is planning on leaving his work in a couple of weeks. He is having more frequent bowel movements. No melena or hematochezia. Some  nausea. No vomiting.  For his his IBS he was previously on Protonix and Bentyl.  Did well with this.  He helps distress will improve once he changes jobs.       Objective:  Physical Exam: BP 123/75   Pulse 91   Temp 98 F (36.7 C) (Temporal)   Ht 5' 9.29" (1.76 m)   Wt 153 lb 6.4 oz (69.6 kg)   SpO2 98%   BMI 22.46 kg/m   Gen: No acute distress, resting comfortably CV: Regular rate and rhythm with no murmurs appreciated Pulm: Normal work of breathing, clear to auscultation bilaterally with no crackles, wheezes, or rhonchi Abdomen: Soft, nondistended.  Tender to palpation in left upper quadrant.  No rebound or guarding.  Bowel sounds present. Neuro: Grossly normal, moves all extremities Psych: Normal affect and thought content      Wyona Neils M. Jimmey Ralph, MD 07/13/2023 10:55 AM

## 2023-07-13 NOTE — Assessment & Plan Note (Addendum)
Restart protonix 40 mg daily.  Has a history of peptic ulcer disease.  We discussed warning signs for recurrent GI bleed and reasons to return to care.

## 2023-07-13 NOTE — Telephone Encounter (Signed)
Please advise if a work excuse is ok to send for patient through next Thursday.

## 2023-07-13 NOTE — Patient Instructions (Signed)
It was very nice to see you today!  We will restart your Protonix and Bentyl.  Please try the amitriptyline at night.  I hope this will help with your stress and also help with your IBS symptoms.  Please send me a message next week to let me know how you are doing.  Return for Follow Up.   Take care, Dr Jimmey Ralph  PLEASE NOTE:  If you had any lab tests, please let us know if you have not heard back within a few days. You may see your results on mychart before we have a chance to review them but we will give you a call once they are reviewed by [place].   If we ordered any referrals today, please let us know if you have not heard from their office within the next week.   If you had any urgent prescriptions sent in today, please check with the pharmacy within an hour of our visit to make sure the prescription was transmitted appropriately.   Please try these tips to maintain a healthy lifestyle:  Eat at least 3 REAL meals and 1-2 snacks per day.  Aim for no more than 5 hours between eating.  If you eat breakfast, please do so within one hour of getting up.   Each meal should contain half fruits/vegetables, one quarter protein, and one quarter carbs (no bigger than a computer mouse)  Cut down on sweet beverages. This includes juice, soda, and sweet tea.   Drink at least 1 glass of water with each meal and aim for at least 8 glasses per day  Exercise at least 150 minutes every week.

## 2023-07-13 NOTE — Assessment & Plan Note (Addendum)
He is having more stress due to work.  He is currently switching jobs which should help alleviate his stress however still has a few more weeks to go before changing.  We discussed management options to help with management for his stress.  He is agreeable to see a therapist however does not think that this is a good time as he is transitioning out of his current job and has not had time to do this.  We also discussed medication options.  Given his concurrent IBS he would be a good candidate for TCA.  We will start amitriptyline 25 mg nightly.  We discussed potential side effects.  Hopefully this will help both with his anxiety/stress/depression as well as his IBS symptoms.  He will follow-up with me in a week or so via MyChart and we can titrate dose as needed.

## 2023-07-14 NOTE — Telephone Encounter (Signed)
Ok to write work note.  Katina Degree. Jimmey Ralph, MD 07/14/2023 7:31 AM

## 2023-07-14 NOTE — Telephone Encounter (Signed)
Note done

## 2023-07-18 ENCOUNTER — Ambulatory Visit: Payer: BC Managed Care – PPO | Admitting: Family Medicine

## 2023-08-04 ENCOUNTER — Other Ambulatory Visit: Payer: Self-pay | Admitting: Family Medicine

## 2023-11-09 ENCOUNTER — Encounter: Payer: Self-pay | Admitting: Family Medicine

## 2023-11-09 ENCOUNTER — Ambulatory Visit (INDEPENDENT_AMBULATORY_CARE_PROVIDER_SITE_OTHER): Payer: Self-pay | Admitting: Family Medicine

## 2023-11-09 VITALS — BP 121/71 | HR 92 | Temp 98.4°F | Ht 69.0 in | Wt 162.2 lb

## 2023-11-09 DIAGNOSIS — L509 Urticaria, unspecified: Secondary | ICD-10-CM

## 2023-11-09 DIAGNOSIS — K589 Irritable bowel syndrome without diarrhea: Secondary | ICD-10-CM

## 2023-11-09 DIAGNOSIS — Z227 Latent tuberculosis: Secondary | ICD-10-CM

## 2023-11-09 DIAGNOSIS — J3089 Other allergic rhinitis: Secondary | ICD-10-CM

## 2023-11-09 NOTE — Progress Notes (Signed)
   Caleb Warner is a 38 y.o. male who presents today for an office visit.  Assessment/Plan:  New/Acute Problems: Encounter for form completion Filled out his form today.  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  He is following with ID for this.  Chronic Problems Addressed Today: Latent tuberculosis by blood test No utility to screen today with skin test or QuantiFERON.  He has completed course of treatment per ID.  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  Other allergic rhinitis Stable on Allegra and Zyrtec.     Subjective:  HPI:  See A/P for status of chronic conditions.  Patient is here today for follow-up.  Needs paperwork completed.  He will be working at [employer] after hours and needs a physical form completed.  Doing well today.  I last saw him a few months ago.  At that time was under a lot of stress.  Was having some GI symptoms as well.  He restarted his IBS regimen with Protonix  and Bentyl .  This is worked very well.  Stress is much better controlled now that he has no longer at his previous job.       Objective:  Physical Exam: BP 121/71 (BP Location: Right Arm, Patient Position: Sitting, Cuff Size: Normal)   Pulse 92   Temp 98.4 F (36.9 C)   Ht 5' 9 (1.753 m)   Wt 162 lb 3.2 oz (73.6 kg)   SpO2 97%   BMI 23.95 kg/m   Gen: No acute distress, resting comfortably CV: Regular rate and rhythm with no murmurs appreciated Pulm: Normal work of breathing, clear to auscultation bilaterally with no crackles, wheezes, or rhonchi Neuro: Grossly normal, moves all extremities Psych: Normal affect and thought content      Hyde Sires M. Kennyth, MD 11/09/2023 12:57 PM

## 2023-11-09 NOTE — Patient Instructions (Addendum)
 It was very nice to see you today!  We will complete your paperwork today.  I am glad you are doing well.  Please let us  know if needing further assistance.  Return if symptoms worsen or fail to improve.   Take care, Dr Kennyth  PLEASE NOTE:  If you had any lab tests, please let us  know if you have not heard back within a few days. You may see your results on mychart before we have a chance to review them but we will give you a call once they are reviewed by us .   If we ordered any referrals today, please let us  know if you have not heard from their office within the next week.   If you had any urgent prescriptions sent in today, please check with the pharmacy within an hour of our visit to make sure the prescription was transmitted appropriately.   Please try these tips to maintain a healthy lifestyle:  Eat at least 3 REAL meals and 1-2 snacks per day.  Aim for no more than 5 hours between eating.  If you eat breakfast, please do so within one hour of getting up.   Each meal should contain half fruits/vegetables, one quarter protein, and one quarter carbs (no bigger than a computer mouse)  Cut down on sweet beverages. This includes juice, soda, and sweet tea.   Drink at least 1 glass of water with each meal and aim for at least 8 glasses per day  Exercise at least 150 minutes every week.

## 2023-11-09 NOTE — Assessment & Plan Note (Signed)
 Symptoms are stable on Allegra and Zyrtec.

## 2023-11-09 NOTE — Assessment & Plan Note (Signed)
 Stable on Allegra and Zyrtec.

## 2023-11-09 NOTE — Assessment & Plan Note (Signed)
 Doing much better today.  We will continue Bentyl  20 mg every 6 hours and Protonix  40 mg daily.

## 2023-11-09 NOTE — Assessment & Plan Note (Signed)
 No utility to screen today with skin test or QuantiFERON.  He has completed course of treatment per ID.
